# Patient Record
Sex: Female | Born: 1980 | Race: Black or African American | Hispanic: No | Marital: Single | Smoking: Former smoker
Health system: Southern US, Community
[De-identification: ages and names within clinical notes are randomized; demographics above are authoritative.]

## PROBLEM LIST (undated history)

## (undated) ENCOUNTER — Inpatient Hospital Stay (HOSPITAL_COMMUNITY): Payer: Self-pay

## (undated) DIAGNOSIS — A599 Trichomoniasis, unspecified: Secondary | ICD-10-CM

## (undated) DIAGNOSIS — N200 Calculus of kidney: Secondary | ICD-10-CM

## (undated) DIAGNOSIS — I499 Cardiac arrhythmia, unspecified: Secondary | ICD-10-CM

## (undated) DIAGNOSIS — K219 Gastro-esophageal reflux disease without esophagitis: Secondary | ICD-10-CM

## (undated) DIAGNOSIS — R7303 Prediabetes: Secondary | ICD-10-CM

## (undated) HISTORY — PX: TONSILLECTOMY: SUR1361

## (undated) HISTORY — PX: CHOLECYSTECTOMY: SHX55

---
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
## 2001-11-19 ENCOUNTER — Emergency Department (HOSPITAL_COMMUNITY): Admission: EM | Admit: 2001-11-19 | Discharge: 2001-11-19 | Payer: Self-pay | Admitting: Emergency Medicine

## 2001-11-19 ENCOUNTER — Encounter: Payer: Self-pay | Admitting: Emergency Medicine

## 2004-03-15 ENCOUNTER — Emergency Department (HOSPITAL_COMMUNITY): Admission: EM | Admit: 2004-03-15 | Discharge: 2004-03-15 | Payer: Self-pay | Admitting: Emergency Medicine

## 2004-12-01 ENCOUNTER — Other Ambulatory Visit: Admission: RE | Admit: 2004-12-01 | Discharge: 2004-12-01 | Payer: Self-pay | Admitting: Family Medicine

## 2005-08-27 ENCOUNTER — Inpatient Hospital Stay (HOSPITAL_COMMUNITY): Admission: AD | Admit: 2005-08-27 | Discharge: 2005-08-28 | Payer: Self-pay | Admitting: Obstetrics & Gynecology

## 2006-03-27 ENCOUNTER — Ambulatory Visit (HOSPITAL_COMMUNITY): Admission: RE | Admit: 2006-03-27 | Discharge: 2006-03-27 | Payer: Self-pay | Admitting: Family Medicine

## 2007-02-13 ENCOUNTER — Other Ambulatory Visit: Admission: RE | Admit: 2007-02-13 | Discharge: 2007-02-13 | Payer: Self-pay | Admitting: Family Medicine

## 2007-06-06 ENCOUNTER — Emergency Department (HOSPITAL_COMMUNITY): Admission: EM | Admit: 2007-06-06 | Discharge: 2007-06-07 | Payer: Self-pay | Admitting: Emergency Medicine

## 2007-06-16 ENCOUNTER — Emergency Department (HOSPITAL_COMMUNITY): Admission: EM | Admit: 2007-06-16 | Discharge: 2007-06-16 | Payer: Self-pay | Admitting: Emergency Medicine

## 2008-09-30 ENCOUNTER — Encounter: Admission: RE | Admit: 2008-09-30 | Discharge: 2008-09-30 | Payer: Self-pay | Admitting: Family Medicine

## 2008-09-30 ENCOUNTER — Encounter (INDEPENDENT_AMBULATORY_CARE_PROVIDER_SITE_OTHER): Payer: Self-pay | Admitting: *Deleted

## 2008-10-14 ENCOUNTER — Ambulatory Visit (HOSPITAL_COMMUNITY): Admission: RE | Admit: 2008-10-14 | Discharge: 2008-10-14 | Payer: Self-pay | Admitting: General Surgery

## 2008-10-14 ENCOUNTER — Encounter (INDEPENDENT_AMBULATORY_CARE_PROVIDER_SITE_OTHER): Payer: Self-pay | Admitting: *Deleted

## 2008-10-14 ENCOUNTER — Encounter (INDEPENDENT_AMBULATORY_CARE_PROVIDER_SITE_OTHER): Payer: Self-pay | Admitting: General Surgery

## 2009-05-17 ENCOUNTER — Ambulatory Visit: Payer: Self-pay | Admitting: Gastroenterology

## 2009-05-17 DIAGNOSIS — R1011 Right upper quadrant pain: Secondary | ICD-10-CM

## 2009-05-17 LAB — CONVERTED CEMR LAB
ALT: 11 units/L (ref 0–35)
Alkaline Phosphatase: 62 units/L (ref 39–117)
Bilirubin, Direct: 0.1 mg/dL (ref 0.0–0.3)
Total Bilirubin: 0.3 mg/dL (ref 0.3–1.2)

## 2009-05-19 ENCOUNTER — Ambulatory Visit (HOSPITAL_COMMUNITY): Admission: RE | Admit: 2009-05-19 | Discharge: 2009-05-19 | Payer: Self-pay | Admitting: Gastroenterology

## 2009-05-20 ENCOUNTER — Telehealth: Payer: Self-pay | Admitting: Gastroenterology

## 2009-05-23 DIAGNOSIS — R112 Nausea with vomiting, unspecified: Secondary | ICD-10-CM

## 2009-05-25 ENCOUNTER — Ambulatory Visit: Payer: Self-pay | Admitting: Gastroenterology

## 2009-05-30 ENCOUNTER — Emergency Department (HOSPITAL_COMMUNITY): Admission: EM | Admit: 2009-05-30 | Discharge: 2009-05-31 | Payer: Self-pay | Admitting: *Deleted

## 2009-06-02 ENCOUNTER — Ambulatory Visit: Payer: Self-pay | Admitting: Gastroenterology

## 2009-06-03 ENCOUNTER — Telehealth: Payer: Self-pay | Admitting: Gastroenterology

## 2009-06-22 ENCOUNTER — Telehealth: Payer: Self-pay | Admitting: Gastroenterology

## 2009-06-22 ENCOUNTER — Encounter: Payer: Self-pay | Admitting: Gastroenterology

## 2009-07-15 ENCOUNTER — Ambulatory Visit (HOSPITAL_COMMUNITY): Admission: RE | Admit: 2009-07-15 | Discharge: 2009-07-15 | Payer: Self-pay | Admitting: Gastroenterology

## 2009-07-20 ENCOUNTER — Encounter: Payer: Self-pay | Admitting: Gastroenterology

## 2009-08-10 ENCOUNTER — Ambulatory Visit: Payer: Self-pay | Admitting: Gastroenterology

## 2009-09-21 ENCOUNTER — Emergency Department (HOSPITAL_COMMUNITY): Admission: EM | Admit: 2009-09-21 | Discharge: 2009-09-21 | Payer: Self-pay | Admitting: Emergency Medicine

## 2010-02-27 ENCOUNTER — Emergency Department (HOSPITAL_COMMUNITY): Admission: EM | Admit: 2010-02-27 | Discharge: 2010-02-27 | Payer: Self-pay | Admitting: Family Medicine

## 2010-11-18 ENCOUNTER — Emergency Department (HOSPITAL_COMMUNITY): Admission: EM | Admit: 2010-11-18 | Discharge: 2010-11-18 | Payer: Self-pay | Admitting: Emergency Medicine

## 2010-12-07 ENCOUNTER — Emergency Department (HOSPITAL_COMMUNITY): Admission: EM | Admit: 2010-12-07 | Discharge: 2010-03-22 | Payer: Self-pay | Admitting: Emergency Medicine

## 2010-12-25 IMAGING — CR DG CHEST 2V
2 series · 2 of 2 positions shown · non-contrast
Comparison: 06/06/2007

CLINICAL DATA: Shortness of breath

CHEST - 2 VIEW

[w chest pa]
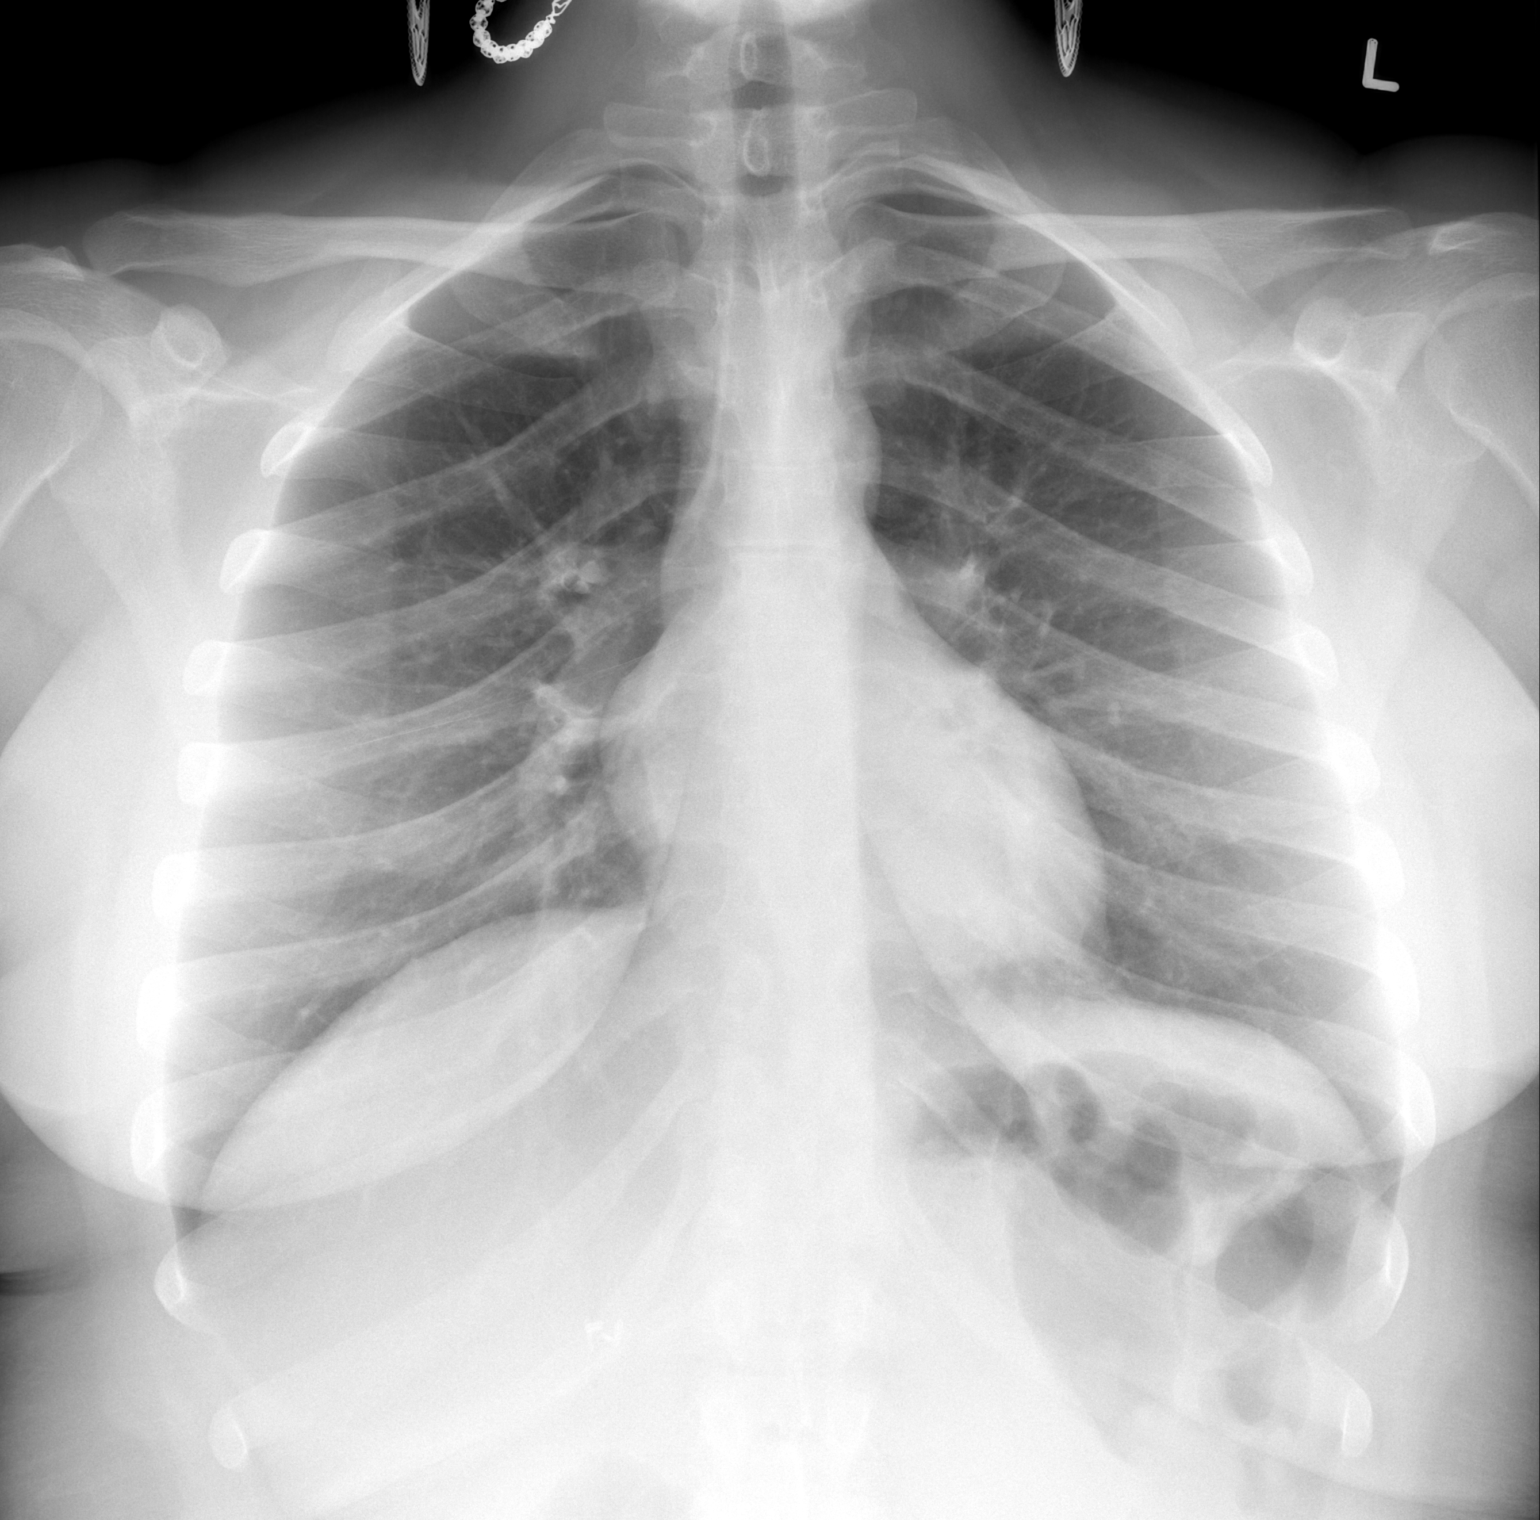

[w chest lat]
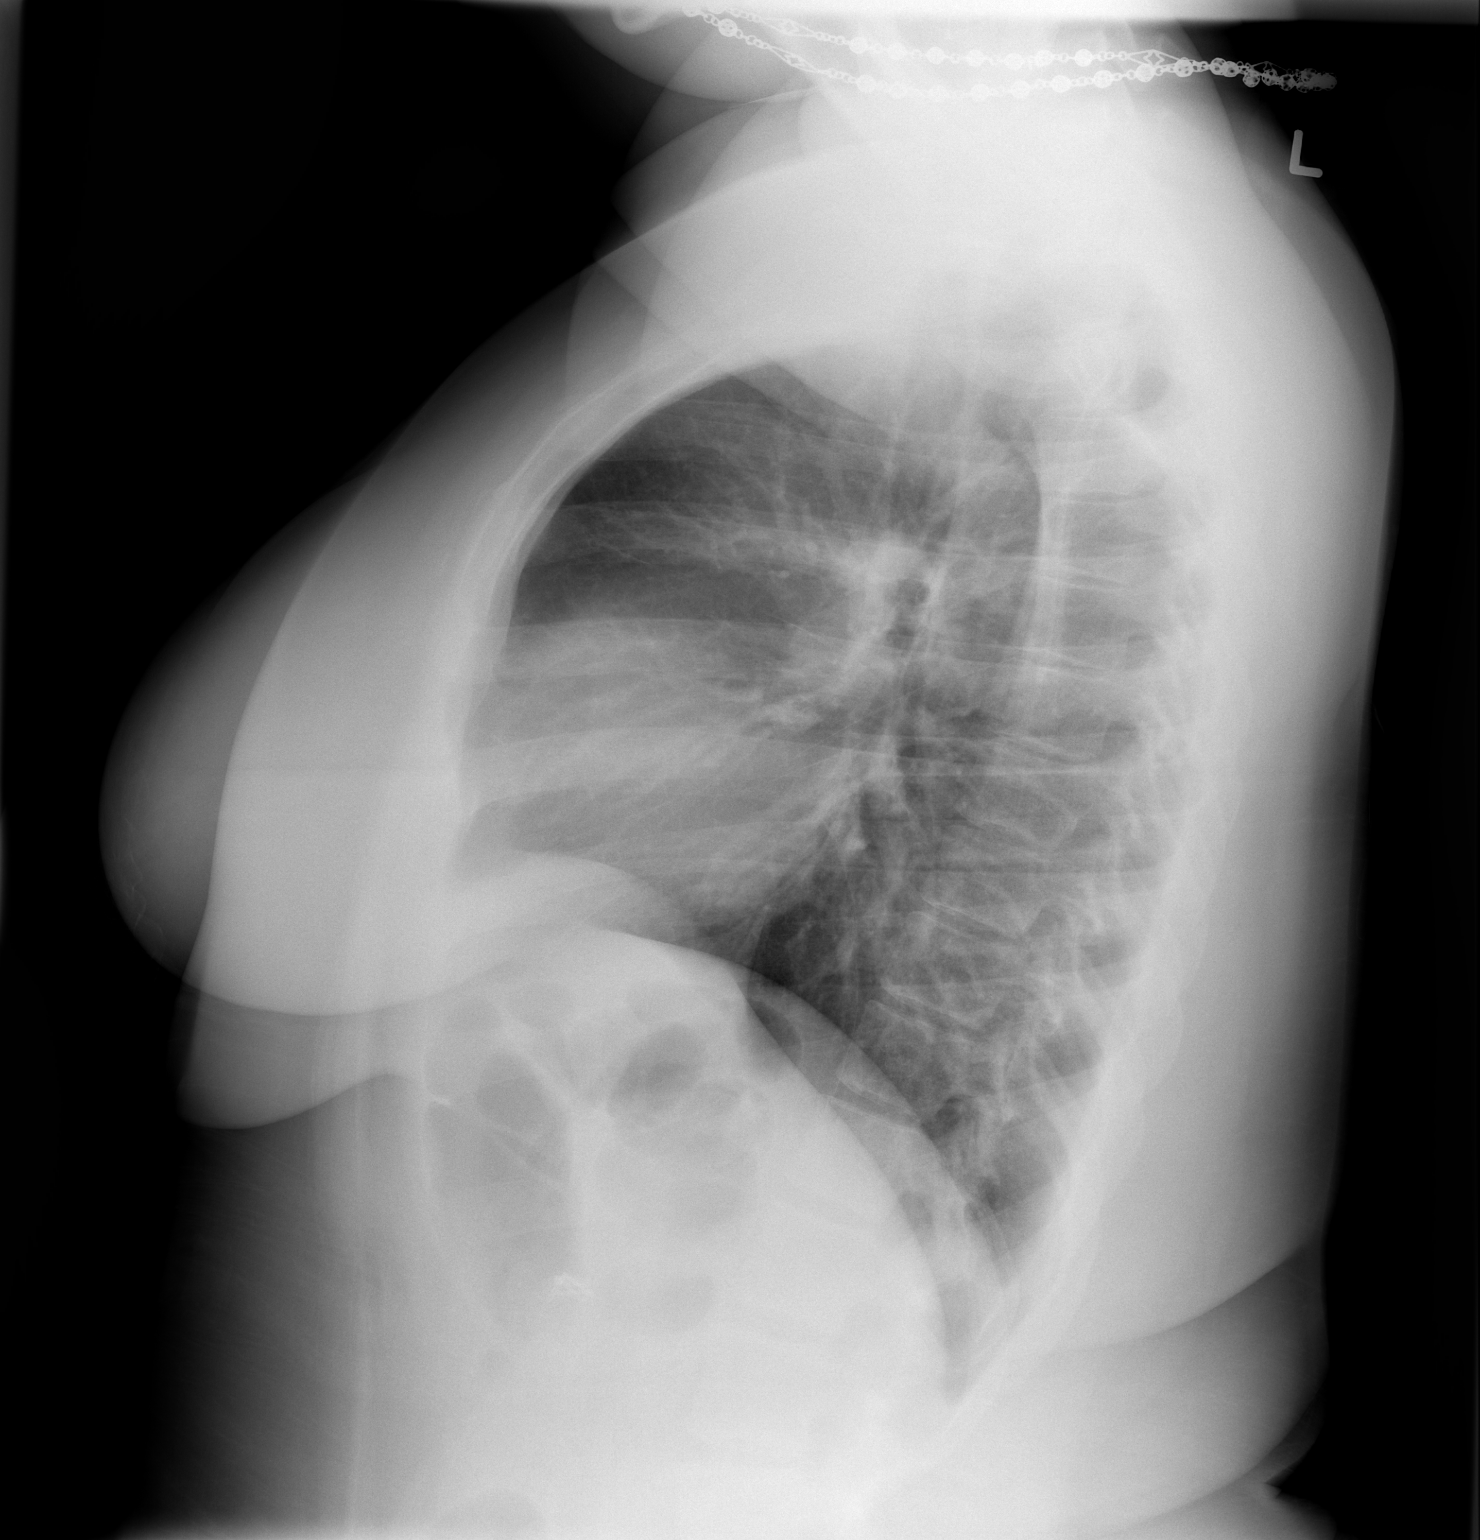

[2 of 2 positions shown; findings below may reference images not displayed]

FINDINGS: The lungs are clear.  Heart and mediastinal contours are
normal.  Osseous structures unremarkable.
IMPRESSION: No acute cardiopulmonary process

## 2011-01-22 ENCOUNTER — Encounter: Payer: Self-pay | Admitting: Gastroenterology

## 2011-03-13 LAB — POCT CARDIAC MARKERS
CKMB, poc: <1 ng/mL — ABNORMAL LOW (ref 1.0–8.0)
Myoglobin, poc: 43.4 ng/mL (ref 12–200)
Troponin i, poc: <0.05 ng/mL (ref 0.00–0.09)

## 2011-03-13 LAB — DIFFERENTIAL
Basophils Absolute: 0 10*3/uL (ref 0.0–0.1)
Basophils Relative: 0 % (ref 0–1)
Lymphocytes Relative: 33 % (ref 12–46)
Monocytes Absolute: 0.6 10*3/uL (ref 0.1–1.0)
Neutro Abs: 2.8 10*3/uL (ref 1.7–7.7)
Neutrophils Relative %: 54 % (ref 43–77)

## 2011-03-13 LAB — CBC
HCT: 35.9 % — ABNORMAL LOW (ref 36.0–46.0)
Hemoglobin: 12.1 g/dL (ref 12.0–15.0)
Platelets: 330 10*3/uL (ref 150–400)
RBC: 4.09 MIL/uL (ref 3.87–5.11)
RDW: 13 % (ref 11.5–15.5)
WBC: 5.1 10*3/uL (ref 4.0–10.5)

## 2011-03-13 LAB — BASIC METABOLIC PANEL
BUN: 10 mg/dL (ref 6–23)
GFR calc Af Amer: >60 mL/min (ref >60)
GFR calc non Af Amer: >60 mL/min (ref >60)

## 2011-03-13 LAB — GLUCOSE, CAPILLARY: Glucose-Capillary: 97 mg/dL (ref 70–99)

## 2011-03-25 LAB — COMPREHENSIVE METABOLIC PANEL
ALT: 10 U/L (ref 0–35)
Albumin: 3.7 g/dL (ref 3.5–5.2)
Alkaline Phosphatase: 51 U/L (ref 39–117)
BUN: 9 mg/dL (ref 6–23)
Calcium: 8.7 mg/dL (ref 8.4–10.5)
Chloride: 109 mEq/L (ref 96–112)
Creatinine, Ser: 0.77 mg/dL (ref 0.4–1.2)
GFR calc non Af Amer: >60 mL/min (ref >60)
Glucose, Bld: 111 mg/dL — ABNORMAL HIGH (ref 70–99)

## 2011-03-25 LAB — DIFFERENTIAL
Basophils Absolute: 0.1 10*3/uL (ref 0.0–0.1)
Lymphs Abs: 2.1 10*3/uL (ref 0.7–4.0)
Monocytes Absolute: 0.6 10*3/uL (ref 0.1–1.0)
Monocytes Relative: 9 % (ref 3–12)

## 2011-03-25 LAB — CBC: HCT: 37.2 % (ref 36.0–46.0)

## 2011-03-25 LAB — LIPASE, BLOOD: Lipase: 27 U/L (ref 11–59)

## 2011-04-09 LAB — POCT I-STAT, CHEM 8
BUN: 11 mg/dL (ref 6–23)
Calcium, Ion: 1.21 mmol/L (ref 1.12–1.32)
HCT: 42 % (ref 36.0–46.0)
Potassium: 3.5 mEq/L (ref 3.5–5.1)
TCO2: 26 mmol/L (ref 0–100)

## 2011-04-09 LAB — POCT CARDIAC MARKERS
CKMB, poc: <1 ng/mL — ABNORMAL LOW (ref 1.0–8.0)
Troponin i, poc: <0.05 ng/mL (ref 0.00–0.09)

## 2011-04-17 ENCOUNTER — Other Ambulatory Visit: Payer: Self-pay | Admitting: Family Medicine

## 2011-04-17 DIAGNOSIS — M545 Low back pain: Secondary | ICD-10-CM

## 2011-04-23 ENCOUNTER — Ambulatory Visit
Admission: RE | Admit: 2011-04-23 | Discharge: 2011-04-23 | Disposition: A | Discharge status: Home / Self Care | Payer: BC Managed Care – PPO | Source: Ambulatory Visit | Attending: Family Medicine | Admitting: Family Medicine

## 2011-04-23 DIAGNOSIS — M545 Low back pain: Secondary | ICD-10-CM

## 2011-05-15 NOTE — Op Note (Signed)
NAMELORAL, CAMPI               ACCOUNT NO.:  [id number]   MEDICAL RECORD NO.:  [id number]          PATIENT TYPE:  AMB   LOCATION:  DAY                          FACILITY:  [facility]   PHYSICIAN:  Almond Lint, MD       DATE OF BIRTH:  06-25-1981   DATE OF PROCEDURE:  10/14/2008  DATE OF DISCHARGE:                               OPERATIVE REPORT   PREOPERATIVE DIAGNOSIS:  Symptomatic cholelithiasis.   POSTOPERATIVE DIAGNOSIS:  Symptomatic cholelithiasis.   PROCEDURE PERFORMED:  Laparoscope cholecystectomy.   SURGEON:  Almond Lint, MD   ASSISTANT:  Juanetta Gosling, MD   ANESTHESIA:  General and local.   FINDINGS:  Contracted gallbladder with significant omentum stuck to it  with a very small cystic duct.   SPECIMEN:  Gallbladder to pathology.   ESTIMATED BLOOD LOSS:  Minimal.   COMPLICATIONS:  None known.   PROCEDURE IN DETAIL:  Ms. Sergent was identified in the holding area and  taken to the operating room where she was placed upon the operating room  table.  General endotracheal anesthesia was induced.  Her abdomen was  prepped and draped in a sterile fashion.  A time-out was performed  according to the surgical safety check list.  Her infraumbilical skin  was anesthetized with a mixture of 1% lidocaine with epinephrine and  0.25% Marcaine plain.  The incision was made vertically with an 11 blade  and the subcutaneous tissues divided bluntly with a Kelly clamp.  Kocher  was used to elevate the umbilical stalk and then another Zannie Cove was used  to grasp the fascia on one side of the midline.  The original Zannie Cove was  removed to the other side of the midline.  The fascia was entered  sharply with the 11 blade.  A Kelly clamp was advanced into the  abdominal cavity to see, to make sure that we were not still  preperitoneal.  There was an additional layer of peritoneum and the  fascia was grasped on the inside of the fascial incision.  The  peritoneum was entered sharply and  confirmed with a Kelly clamp.  A  pursestring 2-0 Vicryl suture was placed around the fascial incision.  The Hasson trocar was advanced into the abdomen and the Kochers were  removed.  Pneumoperitoneum was achieved to a pressure of 50 mmHg.  The  camera was advanced into the abdomen and the epigastric region was  identified.  Local anesthesia was used to identify the site internally  where we put the port and to anesthetize the skin.  The 10/11 port was  placed through the abdominal wall under direct visualization in standard  fashion.  Attention was then directed to the right upper quadrant.  Two  5 mm ports were placed under direct visualization in a standard fashion  as well.  The abdominal wall was very lax and this had to be retracted  with a grasper in order to provide some countertension.  Originally once  the gallbladder was identified there was a significant amount of omentum  stuck all the way up to the dome of  the gallbladder.  The omentum was  removed from the gallbladder with a Art gallery manager.  Care was taken  not to enter the duodenum.  Once the fundus of the gallbladder was  exposed this was grasped and elevated toward the head.  The omentum was  continued to be stripped off of the surface of the gallbladder.  Once  the infundibulum was exposed this was retracted laterally.  There was  still significant amount of omentum stuck to the ductal structures and  this was carefully removed very slowly.  The cystic duct was identified  directly under the gallbladder and this was skeletonized.  The cystic  artery was seen just behind a node of Calot just medial to the cystic  duct and this also was skeletonized.  A clip was placed on the  gallbladder side of the cystic duct and a small ductotomy made with the  scissors.  The cholangiogram catheter was advanced through the abdominal  wall but was unable to be cannulated due to the cystic duct due to the  small size of the ducts.   The critical view was then obtained with the  cystic duct seen directly entering the gallbladder from both the  anterior and the lateral positions.  The duct was then clipped three  times on the patient's side.  A second clip applier was obtained it  appeared that the clip applier was not firing appropriately and keeping  the clips tight.  The second clip applier was functioning much better.  The ductotomy was continued across the duct and the artery was then  clipped twice on the patient's side and once on the specimen side.  This  was then transected and the Bovie electrocautery was used to take the  gallbladder off of the gallbladder fossa.  There was one site where the  liver capsule was torn and this was coagulated with the Bovie.  Once the  gallbladder was completely off of the gallbladder fossa the gallbladder  fossa was again reexamined and there were a few small sites of oozing.  These were coagulated with the Bovie and then irrigated and reinspected.  No additional oozing was seen.  The clips were inspected and there was  no bleeding or bile leakage seen.  The camera was then removed to the  epigastric port and the gallbladder placed into an EndoCatch bag and  pulled out through the umbilical incision.  The camera was then replaced  back through the umbilical incision and the gallbladder fossa  reexamined.  Again, there was no bleeding or bile leakage seen.  The  rest of the irrigation was suctioned through the abdomen and the three  percutaneously placed ports were removed under direct visualization.  The pneumoperitoneum was allowed to escape through the umbilical port  and then Hasson removed.  The pursestring suture was used to secure the  fascial incision.  The fascial incision was then palpated to ensure  there were no weak spots.  There were none and the skin of all the  incisions was closed using a 4-0 Monocryl in a subcuticular fashion.  The abdomen was then washed and  dried and dressed with Dermabond.  The  patient was awakened from anesthesia and taken to the PACU in stable  condition.  Needle and sponge counts were correct x2.      Almond Lint, MD  Electronically Signed     FB/MEDQ  D:  10/14/2008  T:  10/14/2008  Job:  [id number]

## 2011-10-01 LAB — DIFFERENTIAL
Eosinophils Absolute: 0.1
Eosinophils Relative: 1
Lymphs Abs: 2.8

## 2011-10-01 LAB — COMPREHENSIVE METABOLIC PANEL
ALT: 8
AST: 12
Albumin: 3.8
GFR calc Af Amer: >60
Glucose, Bld: 98
Total Bilirubin: 0.6
Total Protein: 7.1

## 2011-10-01 LAB — CBC
HCT: 39.6
MCHC: 33.2
Platelets: 382
RBC: 4.49
WBC: 5.4

## 2012-01-20 ENCOUNTER — Encounter (HOSPITAL_COMMUNITY): Payer: Self-pay | Admitting: *Deleted

## 2012-01-20 ENCOUNTER — Inpatient Hospital Stay (HOSPITAL_COMMUNITY): Payer: BC Managed Care – PPO

## 2012-01-20 ENCOUNTER — Inpatient Hospital Stay (HOSPITAL_COMMUNITY)
Admission: AD | Admit: 2012-01-20 | Discharge: 2012-01-20 | Disposition: A | Discharge status: Home / Self Care | Payer: BC Managed Care – PPO | Source: Ambulatory Visit | Attending: Obstetrics & Gynecology | Admitting: Obstetrics & Gynecology

## 2012-01-20 DIAGNOSIS — O209 Hemorrhage in early pregnancy, unspecified: Secondary | ICD-10-CM

## 2012-01-20 HISTORY — DX: Trichomoniasis, unspecified: A59.9

## 2012-01-20 LAB — ABO/RH: ABO/RH(D): O POS

## 2012-01-20 LAB — URINALYSIS, ROUTINE W REFLEX MICROSCOPIC
Bilirubin Urine: NEGATIVE
Glucose, UA: NEGATIVE mg/dL
Ketones, ur: NEGATIVE mg/dL
pH: 7 (ref 5.0–8.0)

## 2012-01-20 LAB — URINE MICROSCOPIC-ADD ON: WBC, UA: NONE SEEN WBC/hpf (ref <3)

## 2012-01-20 LAB — WET PREP, GENITAL: Yeast Wet Prep HPF POC: NONE SEEN

## 2012-01-20 LAB — CBC
Platelets: 366 10*3/uL (ref 150–400)
RDW: 14.3 % (ref 11.5–15.5)
WBC: 6.2 10*3/uL (ref 4.0–10.5)

## 2012-01-20 LAB — HCG, QUANTITATIVE, PREGNANCY: hCG, Beta Chain, Quant, S: 21865 m[IU]/mL — ABNORMAL HIGH (ref <5)

## 2012-01-20 NOTE — Progress Notes (Signed)
Positive home pregnancy tests x 4, woke up with cramping and bleeding was heavy this morning has lightened up now, mild cramp, N/V, LMP 12/09/11 longer than usual

## 2012-01-20 NOTE — Progress Notes (Signed)
Woke up today with cramping and bleeding after +HPT.

## 2012-01-20 NOTE — ED Provider Notes (Signed)
History     Chief Complaint  Patient presents with  . Abdominal Cramping  . Vaginal Bleeding   HPI Bonnie Carr is 31 y.o. G2 P0010.  LMP 12/09/11.  Was taking birth control pill as directed.  4+ UPTs at home.  Woke up this am with bleeding and cramping.  1 partner.     No past surgical history on file.  No family history on file.  History  Substance Use Topics  . Smoking status: Not on file  . Smokeless tobacco: Not on file  . Alcohol Use: Not on file    Allergies: Allergies not on file  No prescriptions prior to admission    Review of Systems  Constitutional: Negative.   Gastrointestinal: Positive for abdominal pain (mild cramping).  Genitourinary:       + for vaginal bleeding   Physical Exam   Blood pressure 111/66, pulse 78, temperature 98.8 F (37.1 C), height 5\' 6"  (1.676 m), weight 212 lb 6.4 oz (96.344 kg), last menstrual period 12/09/2011.  Physical Exam  Constitutional: She is oriented to person, place, and time. She appears well-developed and well-nourished. No distress.  HENT:  Head: Normocephalic.  Neck: Normal range of motion.  Cardiovascular: Normal rate.   Respiratory: Effort normal.  GI: Soft. She exhibits no distension and no mass. There is no tenderness. There is no rebound and no guarding.  Genitourinary: Uterus is not tender. Right adnexum displays no mass, no tenderness and no fullness. Left adnexum displays no mass, no tenderness and no fullness. There is bleeding (moderate amount of brown blood, with 1 moderate size clot) around the vagina.  Neurological: She is alert and oriented to person, place, and time.  Skin: Skin is warm and dry.   Results for orders placed during the hospital encounter of 01/20/12 (from the past 24 hour(s))  URINALYSIS, ROUTINE W REFLEX MICROSCOPIC     Status: Abnormal   Collection Time   01/20/12  1:40 PM      Component Value Range   Color, Urine YELLOW  YELLOW    APPearance CLEAR  CLEAR    Specific Gravity,  Urine 1.015  1.005 - 1.030    pH 7.0  5.0 - 8.0    Glucose, UA NEGATIVE  NEGATIVE (mg/dL)   Hgb urine dipstick MODERATE (*) NEGATIVE    Bilirubin Urine NEGATIVE  NEGATIVE    Ketones, ur NEGATIVE  NEGATIVE (mg/dL)   Protein, ur NEGATIVE  NEGATIVE (mg/dL)   Urobilinogen, UA 0.2  0.0 - 1.0 (mg/dL)   Nitrite NEGATIVE  NEGATIVE    Leukocytes, UA NEGATIVE  NEGATIVE   URINE MICROSCOPIC-ADD ON     Status: Abnormal   Collection Time   01/20/12  1:40 PM      Component Value Range   Squamous Epithelial / LPF FEW (*) RARE    WBC, UA    <3 (WBC/hpf)   Value: NO FORMED ELEMENTS SEEN ON URINE MICROSCOPIC EXAMINATION   RBC / HPF 0-2  <3 (RBC/hpf)   Bacteria, UA RARE  RARE   POCT PREGNANCY, URINE     Status: Normal   Collection Time   01/20/12  1:52 PM      Component Value Range   Preg Test, Ur POSITIVE    POCT URINE PREGNANCY     Status: Normal   Collection Time   01/20/12  2:00 PM      Component Value Range   Preg Test, Ur Positive    WET PREP, GENITAL  Status: Abnormal   Collection Time   01/20/12  2:10 PM      Component Value Range   Yeast, Wet Prep NONE SEEN  NONE SEEN    Trich, Wet Prep NONE SEEN  NONE SEEN    Clue Cells, Wet Prep NONE SEEN  NONE SEEN    WBC, Wet Prep HPF POC FEW (*) NONE SEEN   CBC     Status: Normal   Collection Time   01/20/12  2:20 PM      Component Value Range   WBC 6.2  4.0 - 10.5 (K/uL)   RBC 4.35  3.87 - 5.11 (MIL/uL)   Hemoglobin 12.7  12.0 - 15.0 (g/dL)   HCT 16.1  09.6 - 04.5 (%)   MCV 87.4  78.0 - 100.0 (fL)   MCH 29.2  26.0 - 34.0 (pg)   MCHC 33.4  30.0 - 36.0 (g/dL)   RDW 40.9  81.1 - 91.4 (%)   Platelets 366  150 - 400 (K/uL)  HCG, QUANTITATIVE, PREGNANCY     Status: Abnormal   Collection Time   01/20/12  2:20 PM      Component Value Range   hCG, Beta Chain, Quant, S 21865 (*) <5 (mIU/mL)  ABO/RH     Status: Normal   Collection Time   01/20/12  2:20 PM      Component Value Range   ABO/RH(D) O POS     ULTRASOUND:  IUGS with YS, FP, and  caridac activity of 97 at [redacted]wks gestation.  Moderate subchorionic hematoma.  No FF and ovaries WNL.   MAU Course  Procedures  GC/Chla culture to lab  MDM  Reviewed labs and U/s report with the patient  Assessment and Plan  A:  Bleeding in early pregnancy      Living IUP  P:  Pelvic rest until bleeding stops.  Return for heavier bleeding      Begin prenatal care with doctor of your choice.  Grover Robinson,EVE M 01/20/2012, 1:43 PM   Matt Holmes, NP 01/20/12 1609

## 2012-01-21 LAB — GC/CHLAMYDIA PROBE AMP, GENITAL: GC Probe Amp, Genital: NEGATIVE

## 2012-04-29 ENCOUNTER — Encounter (HOSPITAL_COMMUNITY): Payer: Self-pay | Admitting: *Deleted

## 2012-04-29 ENCOUNTER — Inpatient Hospital Stay (HOSPITAL_COMMUNITY)
Admission: AD | Admit: 2012-04-29 | Discharge: 2012-04-30 | DRG: 368 | Disposition: A | Discharge status: Home / Self Care | Payer: BC Managed Care – PPO | Source: Ambulatory Visit | Attending: Obstetrics & Gynecology | Admitting: Obstetrics & Gynecology

## 2012-04-29 DIAGNOSIS — A5400 Gonococcal infection of lower genitourinary tract, unspecified: Secondary | ICD-10-CM | POA: Diagnosis present

## 2012-04-29 DIAGNOSIS — N739 Female pelvic inflammatory disease, unspecified: Secondary | ICD-10-CM | POA: Diagnosis present

## 2012-04-29 DIAGNOSIS — A5619 Other chlamydial genitourinary infection: Secondary | ICD-10-CM | POA: Diagnosis present

## 2012-04-29 DIAGNOSIS — A5901 Trichomonal vulvovaginitis: Secondary | ICD-10-CM

## 2012-04-29 DIAGNOSIS — N949 Unspecified condition associated with female genital organs and menstrual cycle: Secondary | ICD-10-CM | POA: Diagnosis present

## 2012-04-29 DIAGNOSIS — A6000 Herpesviral infection of urogenital system, unspecified: Secondary | ICD-10-CM | POA: Diagnosis present

## 2012-04-29 LAB — WET PREP, GENITAL
Clue Cells Wet Prep HPF POC: NONE SEEN
Yeast Wet Prep HPF POC: NONE SEEN

## 2012-04-29 MED ORDER — CEFTRIAXONE SODIUM 250 MG IJ SOLR
250.0000 mg | Freq: Once | INTRAMUSCULAR | Status: AC
  Administered 2012-04-29: 250 mg via INTRAMUSCULAR
  Filled 2012-04-29: qty 250

## 2012-04-29 MED ORDER — VALACYCLOVIR HCL 1 G PO TABS: 1000.0000 mg | ORAL_TABLET | Freq: Two times a day (BID) | ORAL | Status: DC

## 2012-04-29 MED ORDER — AZITHROMYCIN 250 MG PO TABS
1000.0000 mg | ORAL_TABLET | Freq: Once | ORAL | Status: AC
  Administered 2012-04-29: 1000 mg via ORAL
  Filled 2012-04-29: qty 4

## 2012-04-29 NOTE — MAU Provider Note (Signed)
History     CSN: [id number]  Arrival date & time 04/29/12  2154   None     Chief Complaint  Patient presents with  . Vaginal Discharge   HPI Bonnie Carr is a 31 y.o. female who presents to MAU for severe vaginal pain. The pain started about 5 days ago and she thought it was a yeast infection so used OTC yeast cream without relief. The symptoms have gotten worse with more discharge. The patient has been with her current sex partner for 10 years. The history was provided by the patient.  Past Medical History  Diagnosis Date  . Trichomonas     Past Surgical History  Procedure Date  . Cholecystectomy     Family History  Problem Relation Age of Onset  . Hypertension Mother   . Heart disease Mother   . Diabetes Father   . Diabetes Maternal Grandmother     History  Substance Use Topics  . Smoking status: Never Smoker   . Smokeless tobacco: Not on file  . Alcohol Use: Yes    OB History    Grav Para Term Preterm Abortions TAB SAB Ect Mult Living   2    2 2           Review of Systems  Genitourinary: Positive for vaginal discharge and vaginal pain.  All other systems reviewed and are negative.    Allergies  Review of patient's allergies indicates no known allergies.  Home Medications  No current outpatient prescriptions on file.  BP 127/79  Pulse 75  Temp(Src) 99.3 F (37.4 C) (Oral)  Resp 18  Ht 5\' 9"  (1.753 m)  Wt 232 lb (105.235 kg)  BMI 34.26 kg/m2  SpO2 100%  LMP 04/19/2011  Breastfeeding? Unknown  Physical Exam  Nursing note and vitals reviewed. Constitutional: She is oriented to person, place, and time. She appears well-developed and well-nourished.  HENT:  Head: Normocephalic.  Eyes: EOM are normal.  Neck: Neck supple.  Cardiovascular: Normal rate.   Pulmonary/Chest: Effort normal.  Genitourinary:          External genitalia with vesicular lesions noted on the labia minor bilaterally. Tender on exam. Cervix inflamed, frothy yellow  discharge vaginal vault.   Musculoskeletal: Normal range of motion.  Neurological: She is alert and oriented to person, place, and time. No cranial nerve deficit.  Skin: Skin is warm and dry.  Psychiatric: She has a normal mood and affect. Her behavior is normal. Judgment and thought content normal.   Results for orders placed during the hospital encounter of 04/29/12 (from the past 24 hour(s))  WET PREP, GENITAL     Status: Abnormal   Collection Time   04/29/12 10:35 PM      Component Value Range   Yeast Wet Prep HPF POC NONE SEEN  NONE SEEN    Trich, Wet Prep MODERATE (*) NONE SEEN    Clue Cells Wet Prep HPF POC NONE SEEN  NONE SEEN    WBC, Wet Prep HPF POC MODERATE (*) NONE SEEN    Assessment: Genital lesions   Trichomonas vaginosis   Vaginal discharge    Diff. Dx Genital herpes   Gonorrhea   Chlamydia  Plan:  Rocephin 250 mg IM   Zithromax 1 gram PO   Rx Valtrex   Rx Vicodin   GC, Chlamydia and HSV cultures pending   Follow up with [facility]   Return as needed   ED Course: discussed in detail with patient clinical  and lab findings and need for partner treatment and follow up with STI Clinic for further testing HIV, Syphillis and Hepatitis. Patient voices understanding.   Procedures MDM

## 2012-04-29 NOTE — Discharge Instructions (Signed)
Genital Herpes Genital herpes is a sexually transmitted disease. This means that it is a disease passed by having sex with an infected person. There is no cure for genital herpes. The time between attacks can be months to years. The virus may live in a person but produce no problems (symptoms). This infection can be passed to a baby as it travels down the birth canal (vagina). In a newborn, this can cause central nervous system damage, eye damage, or even death. The virus that causes genital herpes is usually HSV-2 virus. The virus that causes oral herpes is usually HSV-1. The diagnosis (learning what is wrong) is made through culture results. SYMPTOMS  Usually symptoms of pain and itching begin a few days to a week after contact. It first appears as small blisters that progress to small painful ulcers which then scab over and heal after several days. It affects the outer genitalia, birth canal, cervix, penis, anal area, buttocks, and thighs. HOME CARE INSTRUCTIONS   Keep ulcerated areas dry and clean.   Take medications as directed. Antiviral medications can speed up healing. They will not prevent recurrences or cure this infection. These medications can also be taken for suppression if there are frequent recurrences.   While the infection is active, it is contagious. Avoid all sexual contact during active infections.   Condoms may help prevent spread of the herpes virus.   Practice safe sex.   Wash your hands thoroughly after touching the genital area.   Avoid touching your eyes after touching your genital area.   Inform your caregiver if you have had genital herpes and become pregnant. It is your responsibility to insure a safe outcome for your baby in this pregnancy.   Only take over-the-counter or prescription medicines for pain, discomfort, or fever as directed by your caregiver.  SEEK MEDICAL CARE IF:   You have a recurrence of this infection.   You do not respond to medications and  are not improving.   You have new sources of pain or discharge which have changed from the original infection.   You have an oral temperature above 102 F (38.9 C).   You develop abdominal pain.   You develop eye pain or signs of eye infection.  Document Released: 12/14/2000 Document Revised: 12/06/2011 Document Reviewed: 01/04/2010 [facility] Patient Information 2012 [place], [place].Trichomoniasis Trichomoniasis is an infection, caused by the Trichomonas organism, that affects both women and men. In women, the outer female genitalia and the vagina are affected. In men, the penis is mainly affected, but the prostate and other reproductive organs can also be involved. Trichomoniasis is a sexually transmitted disease (STD) and is most often passed to another person through sexual contact. The majority of people who get trichomoniasis do so from a sexual encounter and are also at risk for other STDs. CAUSES   Sexual intercourse with an infected partner.   It can be present in swimming pools or hot tubs.  SYMPTOMS   Abnormal gray-green frothy vaginal discharge in women.   Vaginal itching and irritation in women.   Itching and irritation of the area outside the vagina in women.   Penile discharge with or without pain in males.   Inflammation of the urethra (urethritis), causing painful urination.   Bleeding after sexual intercourse.  RELATED COMPLICATIONS  Pelvic inflammatory disease.   Infection of the uterus (endometritis).   Infertility.   Tubal (ectopic) pregnancy.   It can be associated with other STDs, including gonorrhea and chlamydia, hepatitis B,  and HIV.  COMPLICATIONS DURING PREGNANCY  Early (premature) delivery.   Premature rupture of the membranes (PROM).   Low birth weight.  DIAGNOSIS   Visualization of Trichomonas under the microscope from the vagina discharge.   Ph of the vagina greater than 4.5, tested with a test tape.   Trich Rapid Test.   Culture  of the organism, but this is not usually needed.   It may be found on a Pap test.   Having a "strawberry cervix,"which means the cervix looks very red like a strawberry.  TREATMENT   You may be given medication to fight the infection. Inform your caregiver if you could be or are pregnant. Some medications used to treat the infection should not be taken during pregnancy.   Over-the-counter medications or creams to decrease itching or irritation may be recommended.   Your sexual partner will need to be treated if infected.  HOME CARE INSTRUCTIONS   Take all medication prescribed by your caregiver.   Take over-the-counter medication for itching or irritation as directed by your caregiver.   Do not have sexual intercourse while you have the infection.   Do not douche or wear tampons.   Discuss your infection with your partner, as your partner may have acquired the infection from you. Or, your partner may have been the person who transmitted the infection to you.   Have your sex partner examined and treated if necessary.   Practice safe, informed, and protected sex.   See your caregiver for other STD testing.  SEEK MEDICAL CARE IF:   You still have symptoms after you finish the medication.   You have an oral temperature above 102 F (38.9 C).   You develop belly (abdominal) pain.   You have pain when you urinate.   You have bleeding after sexual intercourse.   You develop a rash.   The medication makes you sick or makes you throw up (vomit).  Document Released: 06/12/2001 Document Revised: 12/06/2011 Document Reviewed: 07/08/2009 [facility] Patient Information 2012 [place], [place].

## 2012-04-29 NOTE — MAU Note (Signed)
Pt reports that in February she had an abortion and just started her cycle on 04/18/2012, pt states she had pain and irritation when she tried to use a tampon, after her cycle stopped she started having discharge "like yeast infection" and now she is having pain with urination

## 2012-04-30 LAB — GC/CHLAMYDIA PROBE AMP, GENITAL: Chlamydia, DNA Probe: NEGATIVE

## 2012-05-01 LAB — HERPES SIMPLEX VIRUS CULTURE

## 2012-05-21 ENCOUNTER — Inpatient Hospital Stay (HOSPITAL_COMMUNITY)
Admission: AD | Admit: 2012-05-21 | Discharge: 2012-05-22 | Disposition: A | Discharge status: Home / Self Care | Payer: BC Managed Care – PPO | Source: Ambulatory Visit | Attending: Obstetrics & Gynecology | Admitting: Obstetrics & Gynecology

## 2012-05-21 ENCOUNTER — Encounter (HOSPITAL_COMMUNITY): Payer: Self-pay | Admitting: *Deleted

## 2012-05-21 DIAGNOSIS — A5901 Trichomonal vulvovaginitis: Secondary | ICD-10-CM | POA: Insufficient documentation

## 2012-05-21 DIAGNOSIS — N949 Unspecified condition associated with female genital organs and menstrual cycle: Secondary | ICD-10-CM | POA: Insufficient documentation

## 2012-05-21 LAB — URINALYSIS, ROUTINE W REFLEX MICROSCOPIC
Bilirubin Urine: NEGATIVE
Nitrite: NEGATIVE
Specific Gravity, Urine: >1.03 — ABNORMAL HIGH (ref 1.005–1.030)
pH: 6 (ref 5.0–8.0)

## 2012-05-21 LAB — URINE MICROSCOPIC-ADD ON

## 2012-05-21 NOTE — MAU Note (Signed)
Pt LMP 05/09/2012, G2 P0, having vaginal discharge and discomfort.  Pt seen in MAU 3weeks ago dx with Trich and HSV.

## 2012-05-22 LAB — POCT PREGNANCY, URINE: Preg Test, Ur: NEGATIVE

## 2012-05-22 MED ORDER — METRONIDAZOLE 500 MG PO TABS
2000.0000 mg | ORAL_TABLET | ORAL | Status: AC
  Administered 2012-05-22: 2000 mg via ORAL
  Filled 2012-05-22: qty 4

## 2012-05-22 NOTE — MAU Provider Note (Signed)
History     CSN: [id number]  Arrival date and time: 05/21/12 2214   None    31 y.o.G2P0020 Chief Complaint  Patient presents with  . Vaginal Discharge   HPI Pt presents to MAU with yellow vaginal discharge continuing since diagnosis of trichomonas on 04/29/12.  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  No Flagyl was given or prescribed at that visit.  She has since cancelled her upcoming wedding because of her partner's infidelity and is not currently sexually active.  She denies abdominal pain, urinary symptoms, h/a, vaginal bleeding, dizziness, or n/v.    OB History    Grav Para Term Preterm Abortions TAB SAB Ect Mult Living   2    2 2           Past Medical History  Diagnosis Date  . Trichomonas   . No pertinent past medical history     Past Surgical History  Procedure Date  . Cholecystectomy     Family History  Problem Relation Age of Onset  . Hypertension Mother   . Heart disease Mother   . Diabetes Father   . Diabetes Maternal Grandmother     History  Substance Use Topics  . Smoking status: Never Smoker   . Smokeless tobacco: Not on file  . Alcohol Use: Yes    Allergies: No Known Allergies  Prescriptions prior to admission  Medication Sig Dispense Refill  . acetaminophen (TYLENOL) 500 MG tablet Take 500 mg by mouth every 6 (six) hours as needed. As needed for menstrual pain      . cetirizine (ZYRTEC) 10 MG tablet Take 10 mg by mouth daily. For allergies      . ibuprofen (ADVIL,MOTRIN) 400 MG tablet Take 400 mg by mouth every 6 (six) hours as needed. As needed for menstrual pain      . Multiple Vitamins-Calcium (ONE-A-DAY WOMENS PO) Take 1 tablet by mouth daily.      . valACYclovir (VALTREX) 1000 MG tablet Take 1 tablet (1,000 mg total) by mouth 2 (two) times daily.  20 tablet  0    Review of Systems  Constitutional: Negative for fever, chills and malaise/fatigue.    Eyes: Negative for blurred vision.  Respiratory: Negative for cough and shortness of breath.   Cardiovascular: Negative for chest pain.  Gastrointestinal: Negative for heartburn, nausea and vomiting.  Genitourinary: Negative for dysuria, urgency and frequency.       Vaginal discharge  Musculoskeletal: Negative.   Neurological: Negative for dizziness and headaches.  Psychiatric/Behavioral: Negative for depression.   Physical Exam   Blood pressure 118/61, pulse 80, temperature 98.6 F (37 C), temperature source Oral, resp. rate 18, height 5\' 6"  (1.676 m), weight 105.688 kg (233 lb), last menstrual period 05/09/2012.  Physical Exam  Nursing note and vitals reviewed. Constitutional: She is oriented to person, place, and time. She appears well-developed and well-nourished.  Neck: Normal range of motion.  Cardiovascular: Normal rate.   Respiratory: Effort normal.  GI: Soft.  Musculoskeletal: Normal range of motion.  Neurological: She is alert and oriented to person, place, and time.  Skin: Skin is warm and dry.  Psychiatric: She has a normal mood and affect. Her behavior is normal. Judgment and thought content normal.   Pelvic exam deferred.  GC/Chlamydia done in MAU on 04/29/12 and [facility].  Results for orders placed during the hospital encounter of 05/21/12 (from the  past 24 hour(s))  URINALYSIS, ROUTINE W REFLEX MICROSCOPIC     Status: Abnormal   Collection Time   05/21/12 10:35 PM      Component Value Range   Color, Urine YELLOW  YELLOW    APPearance CLEAR  CLEAR    Specific Gravity, Urine >1.030 (*) 1.005 - 1.030    pH 6.0  5.0 - 8.0    Glucose, UA NEGATIVE  NEGATIVE (mg/dL)   Hgb urine dipstick TRACE (*) NEGATIVE    Bilirubin Urine NEGATIVE  NEGATIVE    Ketones, ur NEGATIVE  NEGATIVE (mg/dL)   Protein, ur NEGATIVE  NEGATIVE (mg/dL)   Urobilinogen, UA 0.2  0.0 - 1.0 (mg/dL)   Nitrite NEGATIVE  NEGATIVE    Leukocytes, UA SMALL (*)  NEGATIVE   URINE MICROSCOPIC-ADD ON     Status: Abnormal   Collection Time   05/21/12 10:35 PM      Component Value Range   Squamous Epithelial / LPF RARE  RARE    WBC, UA 0-2  <3 (WBC/hpf)   RBC / HPF 7-10  <3 (RBC/hpf)   Bacteria, UA FEW (*) RARE    Urine-Other MUCOUS PRESENT    Trichomonas present in urine  MAU Course  Procedures U/a   Assessment and Plan  Trichomonas vaginalis  Flagyl 2g x1 dose in MAU D/C home Return to MAU as needed  LEFTWICH-KIRBY, Joeanthony Seeling 05/22/2012, 12:04 AM

## 2012-05-22 NOTE — Discharge Instructions (Signed)
Trichomoniasis  Trichomoniasis is an infection, caused by the Trichomonas organism, that affects both women and men. In women, the outer female genitalia and the vagina are affected. In men, the penis is mainly affected, but the prostate and other reproductive organs can also be involved. Trichomoniasis is a sexually transmitted disease (STD) and is most often passed to another person through sexual contact. The majority of people who get trichomoniasis do so from a sexual encounter and are also at risk for other STDs.  CAUSES    Sexual intercourse with an infected partner.   It can be present in swimming pools or hot tubs.  SYMPTOMS    Abnormal gray-green frothy vaginal discharge in women.   Vaginal itching and irritation in women.   Itching and irritation of the area outside the vagina in women.   Penile discharge with or without pain in males.   Inflammation of the urethra (urethritis), causing painful urination.   Bleeding after sexual intercourse.  RELATED COMPLICATIONS   Pelvic inflammatory disease.   Infection of the uterus (endometritis).   Infertility.   Tubal (ectopic) pregnancy.   It can be associated with other STDs, including gonorrhea and chlamydia, hepatitis B, and HIV.  COMPLICATIONS DURING PREGNANCY   Early (premature) delivery.   Premature rupture of the membranes (PROM).   Low birth weight.  DIAGNOSIS    Visualization of Trichomonas under the microscope from the vagina discharge.   Ph of the vagina greater than 4.5, tested with a test tape.   Trich Rapid Test.   Culture of the organism, but this is not usually needed.   It may be found on a Pap test.   Having a "strawberry cervix,"which means the cervix looks very red like a strawberry.  TREATMENT    You may be given medication to fight the infection. Inform your caregiver if you could be or are pregnant. Some medications used to treat the infection should not be taken during pregnancy.   Over-the-counter medications or  creams to decrease itching or irritation may be recommended.   Your sexual partner will need to be treated if infected.  HOME CARE INSTRUCTIONS    Take all medication prescribed by your caregiver.   Take over-the-counter medication for itching or irritation as directed by your caregiver.   Do not have sexual intercourse while you have the infection.   Do not douche or wear tampons.   Discuss your infection with your partner, as your partner may have acquired the infection from you. Or, your partner may have been the person who transmitted the infection to you.   Have your sex partner examined and treated if necessary.   Practice safe, informed, and protected sex.   See your caregiver for other STD testing.  SEEK MEDICAL CARE IF:    You still have symptoms after you finish the medication.   You have an oral temperature above 102 F (38.9 C).   You develop belly (abdominal) pain.   You have pain when you urinate.   You have bleeding after sexual intercourse.   You develop a rash.   The medication makes you sick or makes you throw up (vomit).  Document Released: 06/12/2001 Document Revised: 12/06/2011 Document Reviewed: 07/08/2009  [employer] Patient Information 2012 [employer].

## 2012-10-22 ENCOUNTER — Emergency Department (HOSPITAL_BASED_OUTPATIENT_CLINIC_OR_DEPARTMENT_OTHER): Payer: Self-pay

## 2012-10-22 ENCOUNTER — Encounter (HOSPITAL_BASED_OUTPATIENT_CLINIC_OR_DEPARTMENT_OTHER): Payer: Self-pay | Admitting: Emergency Medicine

## 2012-10-22 ENCOUNTER — Emergency Department (HOSPITAL_BASED_OUTPATIENT_CLINIC_OR_DEPARTMENT_OTHER)
Admission: EM | Admit: 2012-10-22 | Discharge: 2012-10-22 | Disposition: A | Discharge status: Home / Self Care | Payer: Self-pay | Attending: Emergency Medicine | Admitting: Emergency Medicine

## 2012-10-22 DIAGNOSIS — Z9889 Other specified postprocedural states: Secondary | ICD-10-CM | POA: Insufficient documentation

## 2012-10-22 DIAGNOSIS — R071 Chest pain on breathing: Secondary | ICD-10-CM | POA: Insufficient documentation

## 2012-10-22 DIAGNOSIS — F17200 Nicotine dependence, unspecified, uncomplicated: Secondary | ICD-10-CM | POA: Insufficient documentation

## 2012-10-22 DIAGNOSIS — R197 Diarrhea, unspecified: Secondary | ICD-10-CM | POA: Insufficient documentation

## 2012-10-22 DIAGNOSIS — R0789 Other chest pain: Secondary | ICD-10-CM

## 2012-10-22 DIAGNOSIS — Z8619 Personal history of other infectious and parasitic diseases: Secondary | ICD-10-CM | POA: Insufficient documentation

## 2012-10-22 LAB — CBC WITH DIFFERENTIAL/PLATELET
Basophils Relative: 0 % (ref 0–1)
Eosinophils Absolute: 0.1 10*3/uL (ref 0.0–0.7)
Lymphs Abs: 2.6 10*3/uL (ref 0.7–4.0)
MCH: 29 pg (ref 26.0–34.0)
Neutro Abs: 3.3 10*3/uL (ref 1.7–7.7)
Neutrophils Relative %: 49 % (ref 43–77)
Platelets: 388 10*3/uL (ref 150–400)
RBC: 4.21 MIL/uL (ref 3.87–5.11)

## 2012-10-22 LAB — BASIC METABOLIC PANEL
Chloride: 101 mEq/L (ref 96–112)
GFR calc Af Amer: >90 mL/min (ref >90)
GFR calc non Af Amer: >90 mL/min (ref >90)
Potassium: 3.5 mEq/L (ref 3.5–5.1)
Sodium: 138 mEq/L (ref 135–145)

## 2012-10-22 LAB — D-DIMER, QUANTITATIVE: D-Dimer, Quant: <0.27 ug/mL-FEU (ref 0.00–0.48)

## 2012-10-22 LAB — TROPONIN I: Troponin I: <0.3 ng/mL (ref <0.30)

## 2012-10-22 MED ORDER — TRAMADOL HCL 50 MG PO TABS: 50.0000 mg | ORAL_TABLET | Freq: Four times a day (QID) | ORAL | Status: DC | PRN

## 2012-10-22 MED ORDER — GI COCKTAIL ~~LOC~~
30.0000 mL | Freq: Once | ORAL | Status: AC
  Administered 2012-10-22: 30 mL via ORAL
  Filled 2012-10-22: qty 30

## 2012-10-22 NOTE — ED Provider Notes (Addendum)
History     CSN: [id number]  Arrival date & time 10/22/12  0053   First MD Initiated Contact with Patient 10/22/12 0146      Chief Complaint  Patient presents with  . Diarrhea  . Chest Pain    (Consider location/radiation/quality/duration/timing/severity/associated sxs/prior treatment) Patient is a 31 y.o. female presenting with diarrhea and chest pain. The history is provided by the patient.  Diarrhea The primary symptoms include diarrhea. Primary symptoms do not include fever, nausea or vomiting. The illness began today. The onset was gradual. The problem has not changed since onset. The illness does not include constipation. Associated medical issues do not include inflammatory bowel disease.  Chest Pain The chest pain began 5 - 7 days ago. Chest pain occurs frequently. The chest pain is unchanged. The pain is associated with breathing. The severity of the pain is severe. The quality of the pain is described as sharp. The pain does not radiate. Pertinent negatives for primary symptoms include no fever, no shortness of breath, no cough, no wheezing, no nausea and no vomiting.  Pertinent negatives for associated symptoms include no diaphoresis. She tried nothing for the symptoms. Risk factors include obesity.  Pertinent negatives for past medical history include no MI.  Procedure history is negative for cardiac catheterization.     Past Medical History  Diagnosis Date  . Trichomonas   . No pertinent past medical history     Past Surgical History  Procedure Date  . Cholecystectomy     Family History  Problem Relation Age of Onset  . Hypertension Mother   . Heart disease Mother   . Diabetes Father   . Diabetes Maternal Grandmother     History  Substance Use Topics  . Smoking status: Current Some Day Smoker  . Smokeless tobacco: Not on file  . Alcohol Use: Yes     wine occassioanlly    OB History    Grav Para Term Preterm Abortions TAB SAB Ect Mult Living   2     2 2           Review of Systems  Constitutional: Negative for fever and diaphoresis.  HENT: Negative for neck pain.   Respiratory: Negative for cough, chest tightness, shortness of breath and wheezing.   Cardiovascular: Positive for chest pain. Negative for leg swelling.  Gastrointestinal: Positive for diarrhea. Negative for nausea, vomiting and constipation.  All other systems reviewed and are negative.    Allergies  Review of patient's allergies indicates no known allergies.  Home Medications   Current Outpatient Rx  Name Route Sig Dispense Refill  . CETIRIZINE HCL 10 MG PO TABS Oral Take 10 mg by mouth daily. For allergies    . ONE-A-DAY WOMENS PO Oral Take 1 tablet by mouth daily.    . ACETAMINOPHEN 500 MG PO TABS Oral Take 500 mg by mouth every 6 (six) hours as needed. As needed for menstrual pain    . IBUPROFEN 400 MG PO TABS Oral Take 400 mg by mouth every 6 (six) hours as needed. As needed for menstrual pain    . VALACYCLOVIR HCL 1 G PO TABS Oral Take 1 tablet (1,000 mg total) by mouth 2 (two) times daily. 20 tablet 0    BP 122/78  Pulse 77  Temp 98.6 F (37 C) (Oral)  Resp 18  Ht 5\' 6"  (1.676 m)  Wt 225 lb (102.059 kg)  BMI 36.32 kg/m2  SpO2 99%  LMP 10/16/2012  Physical Exam  Constitutional: She  is oriented to person, place, and time. She appears well-developed and well-nourished. No distress.  HENT:  Head: Normocephalic and atraumatic.  Mouth/Throat: Oropharynx is clear and moist.  Eyes: Conjunctivae normal are normal. Pupils are equal, round, and reactive to light.  Neck: Normal range of motion. Neck supple.  Cardiovascular: Normal rate and regular rhythm.   Pulmonary/Chest: Effort normal and breath sounds normal. She has no wheezes. She has no rales. She exhibits tenderness.  Abdominal: Soft. Bowel sounds are normal. There is no tenderness. There is no rebound and no guarding.  Musculoskeletal: Normal range of motion. She exhibits no edema.    Neurological: She is alert and oriented to person, place, and time.  Skin: Skin is warm and dry.  Psychiatric: She has a normal mood and affect.    ED Course  Procedures (including critical care time)   Labs Reviewed  CBC WITH DIFFERENTIAL  BASIC METABOLIC PANEL  PREGNANCY, URINE  TROPONIN I  D-DIMER, QUANTITATIVE   No results found.   No diagnosis found.    MDM   Date: 10/22/2012  Rate: 82  Rhythm: normal sinus rhythm  QRS Axis: normal  Intervals: normal  ST/T Wave abnormalities: normal  Conduction Disutrbances: none  Narrative Interpretation: unremarkable    Negative ekg and troponin in the setting of > 8 hours of pain is sufficient to exclude ACS. Pain is reproducible and is consistent with MSK pain.  Return for chest pain shortness of breath or any concerns        Hektor Huston K Sy Saintjean-Rasch, MD 10/22/12 0427  Elliott Lasecki K Lanea Vankirk-Rasch, MD 10/22/12 [phone]

## 2012-10-22 NOTE — ED Notes (Signed)
Pt reports multiple episodes of diarrhea and nausea earlier today. Pt c/o midsternal chest pain tonight with "warm sensation" in left arm. Denies any shob or dizziness.

## 2013-02-01 ENCOUNTER — Encounter (HOSPITAL_COMMUNITY): Payer: Self-pay

## 2013-02-01 ENCOUNTER — Emergency Department (HOSPITAL_COMMUNITY)
Admission: EM | Admit: 2013-02-01 | Discharge: 2013-02-01 | Disposition: A | Discharge status: Home / Self Care | Payer: Self-pay | Attending: Emergency Medicine | Admitting: Emergency Medicine

## 2013-02-01 ENCOUNTER — Emergency Department (HOSPITAL_COMMUNITY): Payer: Self-pay

## 2013-02-01 DIAGNOSIS — N209 Urinary calculus, unspecified: Secondary | ICD-10-CM

## 2013-02-01 DIAGNOSIS — Z3202 Encounter for pregnancy test, result negative: Secondary | ICD-10-CM | POA: Insufficient documentation

## 2013-02-01 DIAGNOSIS — R300 Dysuria: Secondary | ICD-10-CM | POA: Insufficient documentation

## 2013-02-01 DIAGNOSIS — N200 Calculus of kidney: Secondary | ICD-10-CM | POA: Insufficient documentation

## 2013-02-01 DIAGNOSIS — F17200 Nicotine dependence, unspecified, uncomplicated: Secondary | ICD-10-CM | POA: Insufficient documentation

## 2013-02-01 DIAGNOSIS — N1330 Unspecified hydronephrosis: Secondary | ICD-10-CM | POA: Insufficient documentation

## 2013-02-01 DIAGNOSIS — N201 Calculus of ureter: Secondary | ICD-10-CM | POA: Insufficient documentation

## 2013-02-01 DIAGNOSIS — Z8619 Personal history of other infectious and parasitic diseases: Secondary | ICD-10-CM | POA: Insufficient documentation

## 2013-02-01 LAB — CBC
Hemoglobin: 11.8 g/dL — ABNORMAL LOW (ref 12.0–15.0)
MCH: 28.4 pg (ref 26.0–34.0)
MCHC: 33 g/dL (ref 30.0–36.0)
MCV: 86.3 fL (ref 78.0–100.0)
Platelets: 434 10*3/uL — ABNORMAL HIGH (ref 150–400)
RDW: 13.3 % (ref 11.5–15.5)
WBC: 6.1 10*3/uL (ref 4.0–10.5)

## 2013-02-01 LAB — URINE MICROSCOPIC-ADD ON

## 2013-02-01 LAB — URINALYSIS, ROUTINE W REFLEX MICROSCOPIC
Bilirubin Urine: NEGATIVE
Ketones, ur: NEGATIVE mg/dL
Nitrite: NEGATIVE
Protein, ur: NEGATIVE mg/dL
Urobilinogen, UA: 0.2 mg/dL (ref 0.0–1.0)

## 2013-02-01 LAB — BASIC METABOLIC PANEL
BUN: 8 mg/dL (ref 6–23)
CO2: 25 mEq/L (ref 19–32)
Calcium: 9.2 mg/dL (ref 8.4–10.5)
Chloride: 102 mEq/L (ref 96–112)
Creatinine, Ser: 0.82 mg/dL (ref 0.50–1.10)
GFR calc Af Amer: >90 mL/min (ref >90)
Glucose, Bld: 104 mg/dL — ABNORMAL HIGH (ref 70–99)

## 2013-02-01 MED ORDER — KETOROLAC TROMETHAMINE 10 MG PO TABS: 10.0000 mg | ORAL_TABLET | Freq: Four times a day (QID) | ORAL | Status: DC | PRN

## 2013-02-01 MED ORDER — KETOROLAC TROMETHAMINE 30 MG/ML IJ SOLN
30.0000 mg | Freq: Once | INTRAMUSCULAR | Status: AC
  Administered 2013-02-01: 30 mg via INTRAVENOUS
  Filled 2013-02-01: qty 1

## 2013-02-01 MED ORDER — MORPHINE SULFATE 4 MG/ML IJ SOLN
4.0000 mg | Freq: Once | INTRAMUSCULAR | Status: AC
  Administered 2013-02-01: 4 mg via INTRAVENOUS
  Filled 2013-02-01: qty 1

## 2013-02-01 MED ORDER — TAMSULOSIN HCL 0.4 MG PO CAPS: 0.4000 mg | ORAL_CAPSULE | Freq: Every day | ORAL | Status: DC

## 2013-02-01 MED ORDER — ONDANSETRON HCL 4 MG/2ML IJ SOLN
4.0000 mg | Freq: Once | INTRAMUSCULAR | Status: AC
  Administered 2013-02-01: 4 mg via INTRAVENOUS
  Filled 2013-02-01: qty 2

## 2013-02-01 MED ORDER — HYDROCODONE-ACETAMINOPHEN 5-325 MG PO TABS: 1.0000 | ORAL_TABLET | ORAL | Status: DC | PRN

## 2013-02-01 NOTE — ED Provider Notes (Signed)
History     CSN: [id number]  Arrival date & time 02/01/13  0730   First MD Initiated Contact with Patient 02/01/13 [phone]      Chief Complaint  Patient presents with  . Back Pain  . Dysuria   HPI (Consider location/radiation/quality/duration/timing/severity/associated sxs/prior treatment) Patient is a 32 y.o. female presenting with back pain and dysuria. The history is provided by the patient and a significant other.  Back Pain  Associated symptoms include dysuria.  Dysuria   32 year old female presents to emergency department with chief complaint of back pain and suprapubic pain.  Patient was at a bar last evening when she had sudden onset left-sided sharp flank pain that radiated into her left labia and groin.  She states that the pain at that time was 8/10.  She left the bar went home ate and went to sleep.  She was awoken from sleep around 6 this morning with the same left flank pain.  Pain is now constant, sharp, radiating into the groin.  She has associated suprapubic achy pain.  She denies a history of kidney stones.  She does have a mother and aunt with a history of kidney stones.  She has a distant history of urinary tract infection.  She denies fevers, chills, nausea, vomiting.  Denies hematuria or foul odor.  She has had frequency of urine.  Denies vaginal symptoms.   Past Medical History  Diagnosis Date  . Trichomonas   . No pertinent past medical history     Past Surgical History  Procedure Date  . Cholecystectomy     Family History  Problem Relation Age of Onset  . Hypertension Mother   . Heart disease Mother   . Diabetes Father   . Diabetes Maternal Grandmother     History  Substance Use Topics  . Smoking status: Current Some Day Smoker  . Smokeless tobacco: Not on file  . Alcohol Use: Yes     Comment: wine occassioanlly    OB History    Grav Para Term Preterm Abortions TAB SAB Ect Mult Living   2    2 2           Review of Systems  Genitourinary:  Positive for dysuria.  Musculoskeletal: Positive for back pain.  Ten systems reviewed and are negative for acute change, except as above and as noted in the HPI.    Allergies  Review of patient's allergies indicates no known allergies.  Home Medications   Current Outpatient Rx  Name  Route  Sig  Dispense  Refill  . ACETAMINOPHEN 500 MG PO TABS   Oral   Take 500 mg by mouth every 6 (six) hours as needed. As needed for menstrual pain         . CETIRIZINE HCL 10 MG PO TABS   Oral   Take 10 mg by mouth daily. For allergies         . IBUPROFEN 400 MG PO TABS   Oral   Take 400 mg by mouth every 6 (six) hours as needed. As needed for menstrual pain         . ONE-A-DAY WOMENS PO   Oral   Take 1 tablet by mouth daily.           BP 122/80  Pulse 95  Temp 98.3 F (36.8 C) (Oral)  Resp 16  SpO2 99%  LMP 01/26/2013  Physical Exam  Physical Exam  Nursing note and vitals reviewed. Constitutional: She is oriented to  person, place, and time. She appears well-developed and uncomfortable. HENT:  Head: Normocephalic and atraumatic.  Eyes: Conjunctivae normal and EOM are normal. Pupils are equal, round, and reactive to light. No scleral icterus.  Neck: Normal range of motion.  Cardiovascular: Normal rate, regular rhythm and normal heart sounds.  Exam reveals no gallop and no friction rub.   No murmur heard. Pulmonary/Chest: Effort normal and breath sounds normal. No respiratory distress.  Abdominal: Soft. Bowel sounds are normal. She exhibits no distension and no mass.  No  CVA angle tenderness.  Musculoskeletal: tender to deep palpation of the lumbar paraspinals and Quadratus Lumborum. Neurological: She is alert and oriented to person, place, and time.  Skin: Skin is warm and dry. She is not diaphoretic.    ED Course  Procedures (including critical care time)  Labs Reviewed  URINALYSIS, ROUTINE W REFLEX MICROSCOPIC - Abnormal; Notable for the following:    Hgb urine  dipstick LARGE (*)     All other components within normal limits  CBC - Abnormal; Notable for the following:    Hemoglobin 11.8 (*)     HCT 35.8 (*)     Platelets 434 (*)     All other components within normal limits  BASIC METABOLIC PANEL - Abnormal; Notable for the following:    Glucose, Bld 104 (*)     All other components within normal limits  URINE MICROSCOPIC-ADD ON - Abnormal; Notable for the following:    Bacteria, UA FEW (*)     All other components within normal limits  PREGNANCY, URINE   Ct Abdomen Pelvis Wo Contrast  02/01/2013  *RADIOLOGY REPORT*  Clinical Data: Left flank pain.  CT ABDOMEN AND PELVIS WITHOUT CONTRAST  Technique:  Multidetector CT imaging of the abdomen and pelvis was performed following the standard protocol without intravenous contrast.  Comparison: Ultrasound 05/19/2009  Findings: Visualized lung bases clear.  Vascular clips in the gallbladder fossa.  Unremarkable uninfused evaluation of the liver, spleen, adrenal glands, pancreas.  There is mild left hydronephrosis and ureterectasis down to the level of a 4 mm calculus, just proximal to the ureteral orifice. Tiny 1 mm calculi in the lower pole right renal collecting system. Unremarkable uninfused evaluation of the renal parenchyma.  Aorta normal in caliber.  Nondilated stomach, small bowel, and colon. Normal appendix.  Urinary bladder incompletely distended.  No ascites.  No free air.  Uterus and adnexal regions unremarkable except for a  dropped clip near the left ovary. No adenopathy.  IMPRESSION:  1.  4 mm distal left ureteral calculus with mild hydronephrosis. 2.  Right nephrolithiasis.   Original Report Authenticated By: D. Andria Rhein, MD      1. Urolithiasis   2. Nephrolithiasis       MDM  8:33 AM BP 122/80  Pulse 95  Temp 98.3 F (36.8 C) (Oral)  Resp 16  SpO2 99%  LMP 01/26/2013 Patient with PE hx concerning for kidney stone. DDX includes musculoskeletal and UTI. Labs are  pending.   10:39 AM Patient with hematuria, Nephro/uroliothiasis.  Pain is controlled. WIll d/c with pain contorol. Strainer, flomax and urology f/u.  Care plan discussed.  Reasons to return discussed.      Arthor Captain, PA-C 02/01/13 1042

## 2013-02-01 NOTE — ED Provider Notes (Signed)
Medical screening examination/treatment/procedure(s) were performed by non-physician practitioner and as supervising physician I was immediately available for consultation/collaboration.   Lyanne Co, MD 02/01/13 1051

## 2013-02-01 NOTE — ED Notes (Signed)
Patient transported to CT via stretcher.

## 2013-02-01 NOTE — ED Notes (Signed)
Pt states over the past 24hrs she has been having lower back pain and has now moved to her lower abdomen and groin area as well. Denies any difficulty urinating but has had some urgency and feeling of not completely emptying her bladder. Pain is a 7 on pain scale. A sharp throbbing intermit pain.

## 2013-02-01 NOTE — ED Notes (Addendum)
Pt c/o lower back pain radiating into groin area and dysuria.  Pain score 8/10.  Denies injury.  Denies numbness and tingling.

## 2013-02-17 ENCOUNTER — Encounter (HOSPITAL_COMMUNITY): Payer: Self-pay

## 2013-02-17 ENCOUNTER — Emergency Department (HOSPITAL_COMMUNITY)
Admission: EM | Admit: 2013-02-17 | Discharge: 2013-02-17 | Disposition: A | Discharge status: Home / Self Care | Payer: Self-pay | Attending: Emergency Medicine | Admitting: Emergency Medicine

## 2013-02-17 DIAGNOSIS — K602 Anal fissure, unspecified: Secondary | ICD-10-CM | POA: Insufficient documentation

## 2013-02-17 DIAGNOSIS — K625 Hemorrhage of anus and rectum: Secondary | ICD-10-CM | POA: Insufficient documentation

## 2013-02-17 DIAGNOSIS — K921 Melena: Secondary | ICD-10-CM | POA: Insufficient documentation

## 2013-02-17 DIAGNOSIS — Z87442 Personal history of urinary calculi: Secondary | ICD-10-CM | POA: Insufficient documentation

## 2013-02-17 DIAGNOSIS — R079 Chest pain, unspecified: Secondary | ICD-10-CM | POA: Insufficient documentation

## 2013-02-17 DIAGNOSIS — Z8619 Personal history of other infectious and parasitic diseases: Secondary | ICD-10-CM | POA: Insufficient documentation

## 2013-02-17 DIAGNOSIS — E669 Obesity, unspecified: Secondary | ICD-10-CM | POA: Insufficient documentation

## 2013-02-17 DIAGNOSIS — K600 Acute anal fissure: Secondary | ICD-10-CM

## 2013-02-17 DIAGNOSIS — F17200 Nicotine dependence, unspecified, uncomplicated: Secondary | ICD-10-CM | POA: Insufficient documentation

## 2013-02-17 HISTORY — DX: Calculus of kidney: N20.0

## 2013-02-17 LAB — CBC WITH DIFFERENTIAL/PLATELET
Basophils Absolute: 0 10*3/uL (ref 0.0–0.1)
Basophils Relative: 0 % (ref 0–1)
Eosinophils Relative: 0 % (ref 0–5)
Lymphocytes Relative: 34 % (ref 12–46)
MCHC: 33.2 g/dL (ref 30.0–36.0)
Monocytes Absolute: 0.6 10*3/uL (ref 0.1–1.0)
Neutro Abs: 3.9 10*3/uL (ref 1.7–7.7)
Platelets: 393 10*3/uL (ref 150–400)
RDW: 13.4 % (ref 11.5–15.5)
WBC: 6.8 10*3/uL (ref 4.0–10.5)

## 2013-02-17 LAB — COMPREHENSIVE METABOLIC PANEL
Alkaline Phosphatase: 77 U/L (ref 39–117)
BUN: 9 mg/dL (ref 6–23)
GFR calc Af Amer: >90 mL/min (ref >90)
Glucose, Bld: 112 mg/dL — ABNORMAL HIGH (ref 70–99)
Potassium: 3.7 mEq/L (ref 3.5–5.1)
Sodium: 134 mEq/L — ABNORMAL LOW (ref 135–145)
Total Bilirubin: 0.2 mg/dL — ABNORMAL LOW (ref 0.3–1.2)
Total Protein: 7.8 g/dL (ref 6.0–8.3)

## 2013-02-17 LAB — POCT I-STAT TROPONIN I: Troponin i, poc: 0 ng/mL (ref 0.00–0.08)

## 2013-02-17 NOTE — ED Provider Notes (Signed)
History     CSN: [id number]  Arrival date & time 02/17/13  1731   First MD Initiated Contact with Patient 02/17/13 1818      Chief Complaint  Patient presents with  . Chest Pain  . Rectal Bleeding    (Consider location/radiation/quality/duration/timing/severity/associated sxs/prior treatment) HPI Comments: Bonnie Carr is a 32 y.o. Female who has had intermittent chest pain for about 10 days. She has episodes of sharp, central chest pain, without radiation that lasts from seconds to minutes. It tends to improve when she belches. She denies fever, chills, cough, shortness of breath, abdominal pain, nausea, vomiting, diarrhea, or constipation. She has noted blood on toilet tissue after wiping, following a bowel movement for 3 days. She denies hemorrhoids. She was recently treated for an obstructing ureteral stone, but feels like she has passed. She only used a few doses of narcotic pain medicine. She did not see a urologist, for followup due to financial difficulties. There are no modifying factors.  Patient is a 32 y.o. female presenting with chest pain and hematochezia. The history is provided by the patient.  Chest Pain Rectal Bleeding  Associated symptoms include chest pain.    Past Medical History  Diagnosis Date  . Trichomonas   . No pertinent past medical history   . Kidney stones     Past Surgical History  Procedure Laterality Date  . Cholecystectomy      Family History  Problem Relation Age of Onset  . Hypertension Mother   . Heart disease Mother   . Diabetes Father   . Diabetes Maternal Grandmother     History  Substance Use Topics  . Smoking status: Current Some Day Smoker  . Smokeless tobacco: Never Used  . Alcohol Use: Yes     Comment: wine occassioanlly    OB History   Grav Para Term Preterm Abortions TAB SAB Ect Mult Living   2    2 2           Review of Systems  Cardiovascular: Positive for chest pain.  Gastrointestinal: Positive for  hematochezia.  All other systems reviewed and are negative.    Allergies  Review of patient's allergies indicates no known allergies.  Home Medications   Current Outpatient Rx  Name  Route  Sig  Dispense  Refill  . cetirizine (ZYRTEC) 10 MG tablet   Oral   Take 10 mg by mouth daily. For allergies         . HYDROcodone-acetaminophen (NORCO/VICODIN) 5-325 MG per tablet   Oral   Take 1 tablet by mouth every 4 (four) hours as needed for pain.   10 tablet   0   . ketorolac (TORADOL) 10 MG tablet   Oral   Take 1 tablet (10 mg total) by mouth every 6 (six) hours as needed for pain.   20 tablet   0   . Multiple Vitamin (MULTIVITAMIN WITH MINERALS) TABS   Oral   Take 1 tablet by mouth daily.           BP 119/68  Pulse 79  Temp(Src) 97.9 F (36.6 C) (Oral)  Resp 24  SpO2 100%  LMP 01/26/2013  Physical Exam  Nursing note and vitals reviewed. Constitutional: She is oriented to person, place, and time. She appears well-developed and well-nourished.  OBESE  HENT:  Head: Normocephalic and atraumatic.  Eyes: Conjunctivae and EOM are normal. Pupils are equal, round, and reactive to light.  Neck: Normal range of motion and phonation normal.  Neck supple.  Cardiovascular: Normal rate, regular rhythm and intact distal pulses.   Pulmonary/Chest: Effort normal and breath sounds normal. She exhibits no tenderness.  Abdominal: Soft. She exhibits no distension. There is no tenderness. There is no guarding.  Genitourinary:  Small posterior anal fissure, not bleeding. No associated hemorrhoids or swelling  Musculoskeletal: Normal range of motion.  Neurological: She is alert and oriented to person, place, and time. She has normal strength. She exhibits normal muscle tone.  Skin: Skin is warm and dry.  Psychiatric: She has a normal mood and affect. Her behavior is normal. Judgment and thought content normal.    ED Course  Procedures (including critical care time)    Date:  10/17/2012  Rate: 79  Rhythm: normal sinus rhythm  QRS Axis: normal  PR and QT Intervals: normal  ST/T Wave abnormalities: normal  PR and QRS Conduction Disutrbances:none  Narrative Interpretation:   Old EKG Reviewed: unchanged      Labs Reviewed  COMPREHENSIVE METABOLIC PANEL - Abnormal; Notable for the following:    Sodium 134 (*)    Glucose, Bld 112 (*)    Total Bilirubin 0.2 (*)    All other components within normal limits  CBC WITH DIFFERENTIAL  POCT I-STAT TROPONIN I   Nursing notes, applicable records and vitals reviewed.  Radiologic Images/Reports reviewed.    1. Chest pain   2. Acute anal fissure       MDM  Nonspecific chest pain, with negative ED evaluation. Doubt ACS, PE, pneumonia, or musculoskeletal chest wall pain, possible GI related, with the improvement with belching. No indication for further assessment or aggressive treatment. Her rectal bleeding is secondary to a anal fissure, likely improve with time, and local wound care. Further assessment of that problem is not currently necessary     Plan: Home Medications- Tylenol for pain; Home Treatments- wound care of anal fissure; Recommended follow up- PCP prn        Flint Melter, MD 02/18/13 0100

## 2013-02-17 NOTE — ED Notes (Signed)
Patient reports having recurrent intermittent chest pain x 1 1/2 weeks. Patient states she had CP in October and tests were all negative. Patient states that at times the CP goes away when she burps and passes gas. Patient states she has been having bright red blood with occasional clots on the toilet paper for the past 3 days.

## 2013-06-14 ENCOUNTER — Emergency Department (HOSPITAL_BASED_OUTPATIENT_CLINIC_OR_DEPARTMENT_OTHER)
Admission: EM | Admit: 2013-06-14 | Discharge: 2013-06-14 | Disposition: A | Discharge status: Home / Self Care | Payer: Managed Care, Other (non HMO) | Attending: Emergency Medicine | Admitting: Emergency Medicine

## 2013-06-14 ENCOUNTER — Encounter (HOSPITAL_BASED_OUTPATIENT_CLINIC_OR_DEPARTMENT_OTHER): Payer: Self-pay | Admitting: *Deleted

## 2013-06-14 DIAGNOSIS — F17200 Nicotine dependence, unspecified, uncomplicated: Secondary | ICD-10-CM | POA: Insufficient documentation

## 2013-06-14 DIAGNOSIS — A599 Trichomoniasis, unspecified: Secondary | ICD-10-CM | POA: Insufficient documentation

## 2013-06-14 DIAGNOSIS — N898 Other specified noninflammatory disorders of vagina: Secondary | ICD-10-CM | POA: Insufficient documentation

## 2013-06-14 DIAGNOSIS — R197 Diarrhea, unspecified: Secondary | ICD-10-CM | POA: Insufficient documentation

## 2013-06-14 DIAGNOSIS — Z87442 Personal history of urinary calculi: Secondary | ICD-10-CM | POA: Insufficient documentation

## 2013-06-14 DIAGNOSIS — Z3202 Encounter for pregnancy test, result negative: Secondary | ICD-10-CM | POA: Insufficient documentation

## 2013-06-14 HISTORY — DX: Calculus of kidney: N20.0

## 2013-06-14 LAB — WET PREP, GENITAL

## 2013-06-14 LAB — URINALYSIS, ROUTINE W REFLEX MICROSCOPIC
Bilirubin Urine: NEGATIVE
Hgb urine dipstick: NEGATIVE
Protein, ur: NEGATIVE mg/dL
Urobilinogen, UA: 0.2 mg/dL (ref 0.0–1.0)

## 2013-06-14 LAB — URINE MICROSCOPIC-ADD ON

## 2013-06-14 MED ORDER — METRONIDAZOLE 500 MG PO TABS
2000.0000 mg | ORAL_TABLET | Freq: Once | ORAL | Status: AC
  Administered 2013-06-14: 2000 mg via ORAL
  Filled 2013-06-14: qty 4

## 2013-06-14 MED ORDER — LIDOCAINE HCL (PF) 1 % IJ SOLN
INTRAMUSCULAR | Status: AC
  Administered 2013-06-14: 0.9 mL
  Filled 2013-06-14: qty 5

## 2013-06-14 MED ORDER — AZITHROMYCIN 250 MG PO TABS
1000.0000 mg | ORAL_TABLET | Freq: Once | ORAL | Status: AC
  Administered 2013-06-14: 1000 mg via ORAL
  Filled 2013-06-14: qty 4

## 2013-06-14 MED ORDER — ONDANSETRON 4 MG PO TBDP
4.0000 mg | ORAL_TABLET | Freq: Once | ORAL | Status: AC
  Administered 2013-06-14: 4 mg via ORAL
  Filled 2013-06-14: qty 1

## 2013-06-14 MED ORDER — CEFTRIAXONE SODIUM 250 MG IJ SOLR
250.0000 mg | Freq: Once | INTRAMUSCULAR | Status: AC
  Administered 2013-06-14: 250 mg via INTRAMUSCULAR
  Filled 2013-06-14: qty 250

## 2013-06-14 NOTE — ED Notes (Signed)
MD at bedside. 

## 2013-06-14 NOTE — ED Notes (Addendum)
C/o lower back pain that started 2 days ago that radiates into her lower abdomen. Denies any fevers. Also c/o of vaginal d/c that started today. C/o burning with urination. Denies any other symptoms.  States hx of kidney stones.

## 2013-06-14 NOTE — ED Provider Notes (Signed)
History  This chart was scribed for Bonnie Carr B. Bernette Mayers, MD by Manuela Schwartz, ED scribe. This patient was seen in room MH07/MH07 and the patient's care was started at 1942.   CSN: [id number]  Arrival date & time 06/14/13  0865   First MD Initiated Contact with Patient 06/14/13 2150      Chief Complaint  Patient presents with  . Back Pain   The history is provided by the patient. No language interpreter was used.   Bonnie Carr is a 32 y.o. female who presents to the Emergency Department complaining of lower back pain for the last several days associated with vaginal discharge today. She has had some diarrhea chronically and may have seen some blood a few days ago. Pain is not localized to one side. Some nausea, no vomiting or fever. No dysuria.     Past Medical History  Diagnosis Date  . Trichomonas   . No pertinent past medical history   . Kidney stones   . Kidney stone     Past Surgical History  Procedure Laterality Date  . Cholecystectomy      Family History  Problem Relation Age of Onset  . Hypertension Mother   . Heart disease Mother   . Diabetes Father   . Diabetes Maternal Grandmother     History  Substance Use Topics  . Smoking status: Current Some Day Smoker  . Smokeless tobacco: Never Used  . Alcohol Use: Yes     Comment: wine occassioanlly    OB History   Grav Para Term Preterm Abortions TAB SAB Ect Mult Living   2    2 2           Review of Systems A complete 10 system review of systems was obtained and all systems are negative except as noted in the HPI and PMH.   Allergies  Review of patient's allergies indicates no known allergies.  Home Medications   Current Outpatient Rx  Name  Route  Sig  Dispense  Refill  . cetirizine (ZYRTEC) 10 MG tablet   Oral   Take 10 mg by mouth daily. For allergies         . HYDROcodone-acetaminophen (NORCO/VICODIN) 5-325 MG per tablet   Oral   Take 1 tablet by mouth every 4 (four) hours as needed for  pain.   10 tablet   0   . ketorolac (TORADOL) 10 MG tablet   Oral   Take 1 tablet (10 mg total) by mouth every 6 (six) hours as needed for pain.   20 tablet   0   . Multiple Vitamin (MULTIVITAMIN WITH MINERALS) TABS   Oral   Take 1 tablet by mouth daily.           Triage Vitals: BP 121/68  Pulse 80  Temp(Src) 99 F (37.2 C) (Oral)  Resp 18  Ht 5\' 7"  (1.702 m)  Wt 215 lb (97.523 kg)  BMI 33.67 kg/m2  SpO2 100%  LMP 06/08/2013  Physical Exam  Nursing note and vitals reviewed. Constitutional: She is oriented to person, place, and time. She appears well-developed and well-nourished.  HENT:  Head: Normocephalic and atraumatic.  Eyes: EOM are normal. Pupils are equal, round, and reactive to light.  Neck: Normal range of motion. Neck supple.  Cardiovascular: Normal rate, normal heart sounds and intact distal pulses.   Pulmonary/Chest: Effort normal and breath sounds normal.  Abdominal: Bowel sounds are normal. She exhibits no distension. There is no tenderness.  Genitourinary:  Frothy white discharge, no CMT, adnexal tenderness or mass  Musculoskeletal: Normal range of motion. She exhibits no edema and no tenderness.  Neurological: She is alert and oriented to person, place, and time. She has normal strength. No cranial nerve deficit or sensory deficit.  Skin: Skin is warm and dry. No rash noted.  Psychiatric: She has a normal mood and affect.    ED Course  Procedures (including critical care time) DIAGNOSTIC STUDIES: Oxygen Saturation is 100% on room air, normal by my interpretation.    COORDINATION OF CARE: At 1000 PM Discussed treatment plan with patient which includes UA. Patient agrees.   Labs Reviewed  URINALYSIS, ROUTINE W REFLEX MICROSCOPIC - Abnormal; Notable for the following:    APPearance CLOUDY (*)    Leukocytes, UA LARGE (*)    All other components within normal limits  URINE MICROSCOPIC-ADD ON - Abnormal; Notable for the following:    Squamous  Epithelial / LPF MANY (*)    Bacteria, UA MANY (*)    All other components within normal limits  URINE CULTURE  PREGNANCY, URINE   No results found.   1. Trichomonas       MDM  I personally performed the services described in this documentation, which was scribed in my presence. The recorded information has been reviewed and is accurate.   UA is contaminated, doubt UTI given symptoms consistent with trichomonas. Pt informed of results including STD. Treated empirically for GC/C as well. Advised to followup with PCP and inform sexual partners.          Elfriede Bonini B. Bernette Mayers, MD 06/14/13 2314

## 2013-06-15 LAB — GC/CHLAMYDIA PROBE AMP: CT Probe RNA: NEGATIVE

## 2013-06-16 LAB — URINE CULTURE

## 2013-06-17 NOTE — Progress Notes (Signed)
ED Antimicrobial Stewardship Positive Culture Follow Up   Bonnie Carr is an 32 y.o. female who presented to [facility] on 06/14/2013 with a chief complaint of lower back pain, vaginal discharge, and burning with urination  Chief Complaint  Patient presents with  . Back Pain    Recent Results (from the past 720 hour(s))  URINE CULTURE     Status: None   Collection Time    06/14/13  8:10 PM      Result Value Range Status   Specimen Description URINE, CLEAN CATCH   Final   Special Requests NONE   Final   Culture  Setup Time 06/15/2013 08:46   Final   Colony Count 70,000 COLONIES/ML   Final   Culture     Final   Value: GROUP B STREP(S.AGALACTIAE)ISOLATED     Note: TESTING AGAINST S. AGALACTIAE NOT ROUTINELY PERFORMED DUE TO PREDICTABILITY OF AMP/PEN/VAN SUSCEPTIBILITY.   Report Status 06/16/2013 FINAL   Final  WET PREP, GENITAL     Status: Abnormal   Collection Time    06/14/13 10:22 PM      Result Value Range Status   Yeast Wet Prep HPF POC NONE SEEN  NONE SEEN Final   Trich, Wet Prep MANY (*) NONE SEEN Final   Clue Cells Wet Prep HPF POC FEW (*) NONE SEEN Final   WBC, Wet Prep HPF POC MANY (*) NONE SEEN Final  GC/CHLAMYDIA PROBE AMP     Status: None   Collection Time    06/14/13 10:22 PM      Result Value Range Status   CT Probe RNA NEGATIVE  NEGATIVE Final   GC Probe RNA NEGATIVE  NEGATIVE Final   Comment: (NOTE)                                                                                              **Normal Reference Range: Negative**          Assay performed using the Gen-Probe APTIMA COMBO2 (R) Assay.     Acceptable specimen types for this assay include APTIMA Swabs (Unisex,     endocervical, urethral, or vaginal), first void urine, and ThinPrep     liquid based cytology samples.    []  Treated with , organism resistant to prescribed antimicrobial []  Patient discharged originally without antimicrobial agent and treatment is now indicated  The patient was  treated for cervicitis and trichomonas in the [facility] with Azithromycin, Rocephin, and Flagyl. Group B Strep in [facility] is likely colonizer and not true pathogen. Discussed with ED PA -- no treatment deemed necessary.  New antibiotic prescription: No treatment needed  ED Provider: Antony Madura, PA-C  [name] 06/17/2013, 9:58 AM Infectious Diseases Pharmacist Phone# [phone]

## 2013-06-17 NOTE — ED Notes (Signed)
Post ED Visit - Positive Culture Follow-up  Culture report reviewed by antimicrobial stewardship pharmacist: []  Wes Dulaney, Pharm.D., BCPS []  Celedonio Miyamoto, Pharm.D., BCPS [x]  Georgina Pillion, Pharm.D., BCPS []  [place], [address].D., BCPS, AAHIVP []  Estella Husk, Pharm.D., BCPS, AAHIVP  Positive Urine culture  no further patient follow-up is required at this time, likely colonized.  Larena Sox 06/17/2013, 3:13 PM

## 2013-11-12 ENCOUNTER — Encounter (HOSPITAL_COMMUNITY): Payer: Self-pay | Admitting: Emergency Medicine

## 2013-11-12 ENCOUNTER — Emergency Department (HOSPITAL_COMMUNITY): Payer: BC Managed Care – PPO

## 2013-11-12 ENCOUNTER — Emergency Department (HOSPITAL_COMMUNITY)
Admission: EM | Admit: 2013-11-12 | Discharge: 2013-11-12 | Disposition: A | Discharge status: Home / Self Care | Payer: BC Managed Care – PPO | Attending: Emergency Medicine | Admitting: Emergency Medicine

## 2013-11-12 DIAGNOSIS — Z79899 Other long term (current) drug therapy: Secondary | ICD-10-CM | POA: Insufficient documentation

## 2013-11-12 DIAGNOSIS — Z3202 Encounter for pregnancy test, result negative: Secondary | ICD-10-CM | POA: Insufficient documentation

## 2013-11-12 DIAGNOSIS — Z8619 Personal history of other infectious and parasitic diseases: Secondary | ICD-10-CM | POA: Insufficient documentation

## 2013-11-12 DIAGNOSIS — Z87442 Personal history of urinary calculi: Secondary | ICD-10-CM | POA: Insufficient documentation

## 2013-11-12 DIAGNOSIS — F17200 Nicotine dependence, unspecified, uncomplicated: Secondary | ICD-10-CM | POA: Insufficient documentation

## 2013-11-12 DIAGNOSIS — R42 Dizziness and giddiness: Secondary | ICD-10-CM | POA: Insufficient documentation

## 2013-11-12 DIAGNOSIS — Z7982 Long term (current) use of aspirin: Secondary | ICD-10-CM | POA: Insufficient documentation

## 2013-11-12 DIAGNOSIS — R079 Chest pain, unspecified: Secondary | ICD-10-CM | POA: Insufficient documentation

## 2013-11-12 LAB — BASIC METABOLIC PANEL
CO2: 24 mEq/L (ref 19–32)
Calcium: 10 mg/dL (ref 8.4–10.5)
Chloride: 100 mEq/L (ref 96–112)
GFR calc Af Amer: >90 mL/min (ref >90)
Sodium: 135 mEq/L (ref 135–145)

## 2013-11-12 LAB — POCT I-STAT TROPONIN I: Troponin i, poc: 0 ng/mL (ref 0.00–0.08)

## 2013-11-12 LAB — CBC
MCH: 28.1 pg (ref 26.0–34.0)
Platelets: 459 10*3/uL — ABNORMAL HIGH (ref 150–400)
RBC: 4.27 MIL/uL (ref 3.87–5.11)
WBC: 9.1 10*3/uL (ref 4.0–10.5)

## 2013-11-12 MED ORDER — ASPIRIN 81 MG PO CHEW
81.0000 mg | CHEWABLE_TABLET | Freq: Once | ORAL | Status: AC
  Administered 2013-11-12: 81 mg via ORAL
  Filled 2013-11-12: qty 1

## 2013-11-12 MED ORDER — ASPIRIN 81 MG PO CHEW: 81.0000 mg | CHEWABLE_TABLET | Freq: Every day | ORAL | Status: DC

## 2013-11-12 NOTE — ED Provider Notes (Signed)
CSN: [id number]     Arrival date & time 11/12/13  0039 History   First MD Initiated Contact with Patient 11/12/13 0136     Chief Complaint  Patient presents with  . Chest Pain   (Consider location/radiation/quality/duration/timing/severity/associated sxs/prior Treatment) HPI Comments: States that she's had left chest pain.  Under her breasts for the past 3, days, intermittently.  On day one, she had one episode of dizziness.  That stopped with rest.  She can give a timeframe or precipitating cause for her chest pain, not associated with nausea, or shortness of breath.  Yesterday, and, today.  She's had several episodes tonight episode radiated to her left arm.  It lasted approximately one hour. She currently does not have primary care physician.  She does not use any kind of hormone birth control.  Does not travel.  She does smoke.  Daily.  She has a sedentary lifestyle, but has not traveled recently.  Denies any swelling in her legs or history of DVTs.  She does report that her mother had a heart attack at an early age in her 52s.  They related this to a heart murmur.  Patient is a 32 y.o. female presenting with chest pain. The history is provided by the patient.  Chest Pain Pain location:  Substernal area and L chest Pain quality: stabbing   Pain radiates to:  L shoulder and L arm Pain radiates to the back: no   Pain severity:  Moderate Onset quality:  Gradual Duration:  3 days Timing:  Intermittent Progression:  Waxing and waning Chronicity:  Recurrent Context: at rest and stress   Context: not breathing, no drug use, not eating, no intercourse, not lifting, not raising an arm and no trauma   Relieved by:  None tried Worsened by:  Nothing tried Ineffective treatments:  None tried Associated symptoms: dizziness   Associated symptoms: no cough, no fatigue, no fever, no headache, no heartburn, no lower extremity edema, no nausea, no numbness, no palpitations, no shortness of breath, no  syncope and not vomiting     Past Medical History  Diagnosis Date  . Trichomonas   . No pertinent past medical history   . Kidney stones   . Kidney stone    Past Surgical History  Procedure Laterality Date  . Cholecystectomy     Family History  Problem Relation Age of Onset  . Hypertension Mother   . Heart disease Mother   . Diabetes Father   . Diabetes Maternal Grandmother    History  Substance Use Topics  . Smoking status: Current Some Day Smoker  . Smokeless tobacco: Never Used  . Alcohol Use: Yes     Comment: wine occassioanlly   OB History   Grav Para Term Preterm Abortions TAB SAB Ect Mult Living   2    2 2          Review of Systems  Constitutional: Negative for fever and fatigue.  Respiratory: Negative for cough and shortness of breath.   Cardiovascular: Positive for chest pain. Negative for palpitations, leg swelling and syncope.  Gastrointestinal: Negative for heartburn, nausea and vomiting.  Musculoskeletal: Negative for neck pain.  Neurological: Positive for dizziness. Negative for numbness and headaches.  All other systems reviewed and are negative.    Allergies  Review of patient's allergies indicates no known allergies.  Home Medications   Current Outpatient Rx  Name  Route  Sig  Dispense  Refill  . cetirizine (ZYRTEC) 10 MG tablet  Oral   Take 10 mg by mouth daily. For allergies         . ibuprofen (ADVIL,MOTRIN) 200 MG tablet   Oral   Take 600 mg by mouth every 6 (six) hours as needed for cramping.         Marland Kitchen aspirin 81 MG chewable tablet   Oral   Chew 1 tablet (81 mg total) by mouth daily.   30 tablet   0    BP 134/77  Temp(Src) 98.2 F (36.8 C) (Oral)  Resp 23  SpO2 99%  LMP 11/05/2013 Physical Exam  Nursing note and vitals reviewed. Constitutional: She is oriented to person, place, and time. She appears well-developed and well-nourished.  HENT:  Head: Normocephalic.  Eyes: Pupils are equal, round, and reactive to  light.  Neck: Normal range of motion.  Cardiovascular: Normal rate, regular rhythm and normal heart sounds.   No murmur heard. Pulmonary/Chest: Effort normal and breath sounds normal.  Abdominal: Soft. Bowel sounds are normal.  Musculoskeletal: Normal range of motion.  Neurological: She is alert and oriented to person, place, and time.  Skin: Skin is warm and dry.    ED Course  Procedures (including critical care time) Labs Review Labs Reviewed  CBC - Abnormal; Notable for the following:    Platelets 459 (*)    All other components within normal limits  BASIC METABOLIC PANEL - Abnormal; Notable for the following:    Potassium 3.3 (*)    All other components within normal limits  POCT I-STAT TROPONIN I  POCT PREGNANCY, URINE   Imaging Review Dg Chest 2 View  11/12/2013   CLINICAL DATA:  Chest pain for 3 days.  EXAM: CHEST  2 VIEW  COMPARISON:  Chest radiograph performed 10/22/2012  FINDINGS: The lungs are well-aerated and clear. There is no evidence of focal opacification, pleural effusion or pneumothorax.  The heart is normal in size; the mediastinal contour is within normal limits. No acute osseous abnormalities are seen. Clips are noted within the right upper quadrant, reflecting prior cholecystectomy.  IMPRESSION: No acute cardiopulmonary process seen.   Electronically Signed   By: Roanna Raider M.D.   On: 11/12/2013 02:06    EKG Interpretation     Ventricular Rate:  88 PR Interval:  150 QRS Duration: 83 QT Interval:  349 QTC Calculation: 422 R Axis:   42 Text Interpretation:  Sinus rhythm No significant change since last tracing            MDM   1. Nonspecific chest pain     I discussed this patient with Dr. on her.  Reviewed.  Labs, EKG, risk factors.  Patient will be discharged home with cardiology referral   Arman Filter, NP 11/12/13 [phone]

## 2013-11-12 NOTE — ED Notes (Signed)
Pt complains of central chest pain that radiates under her breast for two days

## 2013-11-12 NOTE — ED Notes (Signed)
Pt unable to void at this time. 

## 2013-11-12 NOTE — ED Provider Notes (Signed)
Medical screening examination/treatment/procedure(s) were performed by non-physician practitioner and as supervising physician I was immediately available for consultation/collaboration.  EKG Interpretation     Ventricular Rate:  88 PR Interval:  150 QRS Duration: 83 QT Interval:  349 QTC Calculation: 422 R Axis:   42 Text Interpretation:  Sinus rhythm No significant change since last tracing             Olivia Mackie, MD 11/12/13 [phone]

## 2013-12-04 ENCOUNTER — Encounter: Payer: Self-pay | Admitting: Internal Medicine

## 2013-12-04 ENCOUNTER — Ambulatory Visit (INDEPENDENT_AMBULATORY_CARE_PROVIDER_SITE_OTHER): Payer: BC Managed Care – PPO | Admitting: Internal Medicine

## 2013-12-04 VITALS — BP 112/80 | HR 86 | Ht 66.0 in | Wt 261.0 lb

## 2013-12-04 DIAGNOSIS — Z0000 Encounter for general adult medical examination without abnormal findings: Secondary | ICD-10-CM

## 2013-12-04 LAB — LIPID PANEL
Cholesterol: 152 mg/dL (ref 0–200)
HDL: 34.2 mg/dL — ABNORMAL LOW (ref >39.00)
VLDL: 22 mg/dL (ref 0.0–40.0)

## 2013-12-04 NOTE — Patient Instructions (Signed)
Your physician recommends that you schedule a follow-up appointment in: as needed basis  Your physician recommends that you get lab work today ( lipid )

## 2013-12-04 NOTE — Progress Notes (Signed)
HPI Patient is a 32 yo who was referred from ER for evaluation of CP The patient has had CP in past.  She was seen in ER for L sided pain  She points to under her L breast.  Pain is not associated with activity.  Can last prolonged periods.   Denies SOB  May be pleuritic at times.   When not having she denies SOB   She is not that active  No recent long distance travel.   No Known Allergies  Current Outpatient Prescriptions  Medication Sig Dispense Refill  . cetirizine (ZYRTEC) 10 MG tablet Take 10 mg by mouth daily. For allergies      . ibuprofen (ADVIL,MOTRIN) 200 MG tablet Take 600 mg by mouth every 6 (six) hours as needed for cramping.      Marland Kitchen aspirin 81 MG chewable tablet Chew 1 tablet (81 mg total) by mouth daily.  30 tablet  0   No current facility-administered medications for this visit.    Past Medical History  Diagnosis Date  . Trichomonas   . No pertinent past medical history   . Kidney stones   . Kidney stone     Past Surgical History  Procedure Laterality Date  . Cholecystectomy      Family History  Problem Relation Age of Onset  . Hypertension Mother   . Heart disease Mother   . Diabetes Father   . Diabetes Maternal Grandmother     History   Social History  . Marital Status: Single    Spouse Name: N/A    Number of Children: N/A  . Years of Education: N/A   Occupational History  . Not on file.   Social History Main Topics  . Smoking status: Current Some Day Smoker  . Smokeless tobacco: Never Used  . Alcohol Use: Yes     Comment: wine occassioanlly  . Drug Use: No  . Sexual Activity: Yes    Birth Control/ Protection: None   Other Topics Concern  . Not on file   Social History Narrative  . No narrative on file    Review of Systems:  All systems reviewed.  They are negative to the above problem except as previously stated.  Vital Signs: BP 112/80  Pulse 86  Ht 5\' 6"  (1.676 m)  Wt 261 lb (118.389 kg)  BMI 42.15 kg/m2  LMP  11/05/2013  Physical Exam Patient isa morbidly obese 32 yo in NAD HEENT:  Normocephalic, atraumatic. EOMI, PERRLA.  Neck: JVP is normal.  No bruits.  Lungs: clear to auscultation. No rales no wheezes.  Heart: Regular rate and rhythm. Normal S1, S2. No S3.   No significant murmurs. PMI not displaced.  Abdomen:  Supple, nontender. Normal bowel sounds. No hepatomegaly.  Extremities:   Good distal pulses throughout. No lower extremity edema.  Musculoskeletal :moving all extremities.  Neuro:   alert and oriented x3.  CN II-XII grossly intact.  EKG  SR 87 bpm  Nonspecific ST T wave changes   Assessment and Plan:  1.  CP   Pain is atypical  I think it is musculoskeletal  I do not think cardiac in origin. Take activities as tolerated.    2.  HCM  Counselled on wt loss, exercise.  Would check fasting lipids.    3.  Tob  Counselled on cessation.

## 2013-12-10 ENCOUNTER — Telehealth: Payer: Self-pay | Admitting: Internal Medicine

## 2013-12-10 NOTE — Telephone Encounter (Signed)
Left message for pt to call.

## 2013-12-10 NOTE — Telephone Encounter (Signed)
Follow Up  Pt returned call//Results??//SR

## 2013-12-16 NOTE — Telephone Encounter (Signed)
Left message of results for pt, Letter of results sent to pt

## 2014-04-22 ENCOUNTER — Other Ambulatory Visit: Payer: Self-pay | Admitting: Obstetrics and Gynecology

## 2014-09-11 ENCOUNTER — Encounter: Payer: Self-pay | Admitting: Gastroenterology

## 2014-11-01 ENCOUNTER — Encounter: Payer: Self-pay | Admitting: Internal Medicine

## 2015-03-02 ENCOUNTER — Encounter (HOSPITAL_COMMUNITY): Payer: Self-pay

## 2015-03-02 ENCOUNTER — Inpatient Hospital Stay (HOSPITAL_COMMUNITY)
Admission: AD | Admit: 2015-03-02 | Discharge: 2015-03-02 | Disposition: A | Discharge status: Home / Self Care | Payer: BLUE CROSS/BLUE SHIELD | Source: Ambulatory Visit | Attending: Obstetrics and Gynecology | Admitting: Obstetrics and Gynecology

## 2015-03-02 ENCOUNTER — Inpatient Hospital Stay (HOSPITAL_COMMUNITY): Payer: BLUE CROSS/BLUE SHIELD

## 2015-03-02 DIAGNOSIS — Z87442 Personal history of urinary calculi: Secondary | ICD-10-CM | POA: Diagnosis not present

## 2015-03-02 DIAGNOSIS — O4691 Antepartum hemorrhage, unspecified, first trimester: Secondary | ICD-10-CM | POA: Diagnosis not present

## 2015-03-02 DIAGNOSIS — O99331 Smoking (tobacco) complicating pregnancy, first trimester: Secondary | ICD-10-CM | POA: Insufficient documentation

## 2015-03-02 DIAGNOSIS — Z3A01 Less than 8 weeks gestation of pregnancy: Secondary | ICD-10-CM | POA: Diagnosis not present

## 2015-03-02 DIAGNOSIS — Z3A13 13 weeks gestation of pregnancy: Secondary | ICD-10-CM | POA: Diagnosis not present

## 2015-03-02 DIAGNOSIS — O209 Hemorrhage in early pregnancy, unspecified: Secondary | ICD-10-CM | POA: Insufficient documentation

## 2015-03-02 LAB — CBC
HEMATOCRIT: 35.8 % — AB (ref 36.0–46.0)
Hemoglobin: 12 g/dL (ref 12.0–15.0)
MCH: 28.2 pg (ref 26.0–34.0)
MCHC: 33.5 g/dL (ref 30.0–36.0)
MCV: 84.2 fL (ref 78.0–100.0)
PLATELETS: 374 10*3/uL (ref 150–400)
RBC: 4.25 MIL/uL (ref 3.87–5.11)
RDW: 15.1 % (ref 11.5–15.5)
WBC: 7 10*3/uL (ref 4.0–10.5)

## 2015-03-02 LAB — URINE MICROSCOPIC-ADD ON

## 2015-03-02 LAB — URINALYSIS, ROUTINE W REFLEX MICROSCOPIC
Glucose, UA: NEGATIVE mg/dL
KETONES UR: NEGATIVE mg/dL
NITRITE: POSITIVE — AB
Protein, ur: 100 mg/dL — AB
SPECIFIC GRAVITY, URINE: 1.025 (ref 1.005–1.030)
UROBILINOGEN UA: 0.2 mg/dL (ref 0.0–1.0)
pH: 5.5 (ref 5.0–8.0)

## 2015-03-02 LAB — POCT PREGNANCY, URINE: PREG TEST UR: POSITIVE — AB

## 2015-03-02 LAB — WET PREP, GENITAL
CLUE CELLS WET PREP: NONE SEEN
TRICH WET PREP: NONE SEEN
WBC WET PREP: NONE SEEN
YEAST WET PREP: NONE SEEN

## 2015-03-02 LAB — HCG, QUANTITATIVE, PREGNANCY: HCG, BETA CHAIN, QUANT, S: 40834 m[IU]/mL — AB (ref <5)

## 2015-03-02 NOTE — Discharge Instructions (Signed)
Pelvic Rest °Pelvic rest is sometimes recommended for women when:  °· The placenta is partially or completely covering the opening of the cervix (placenta previa). °· There is bleeding between the uterine wall and the amniotic sac in the first trimester (subchorionic hemorrhage). °· The cervix begins to open without labor starting (incompetent cervix, cervical insufficiency). °· The labor is too early (preterm labor). °HOME CARE INSTRUCTIONS °· Do not have sexual intercourse, stimulation, or an orgasm. °· Do not use tampons, douche, or put anything in the vagina. °· Do not lift anything over 10 pounds (4.5 kg). °· Avoid strenuous activity or straining your pelvic muscles. °SEEK MEDICAL CARE IF:  °· You have any vaginal bleeding during pregnancy. Treat this as a potential emergency. °· You have cramping pain felt low in the stomach (stronger than menstrual cramps). °· You notice vaginal discharge (watery, mucus, or bloody). °· You have a low, dull backache. °· There are regular contractions or uterine tightening. °SEEK IMMEDIATE MEDICAL CARE IF: °You have vaginal bleeding and have placenta previa.  °Document Released: 04/13/2011 Document Revised: 03/10/2012 Document Reviewed: 04/13/2011 °ExitCare® Patient Information ©2015 [employer]. This information is not intended to replace advice given to you by your health care provider. Make sure you discuss any questions you have with your health care provider. ° °Vaginal Bleeding During Pregnancy, First Trimester °A small amount of bleeding (spotting) from the vagina is common in early pregnancy. Sometimes the bleeding is normal and is not a problem, and sometimes it is a sign of something serious. Be sure to tell your doctor about any bleeding from your vagina right away. °HOME CARE °· Watch your condition for any changes. °· Follow your doctor's instructions about how active you can be. °· If you are on bed rest: °¨ You may need to stay in bed and only get up to use the  bathroom. °¨ You may be allowed to do some activities. °¨ If you need help, make plans for someone to help you. °· Write down: °¨ The number of pads you use each day. °¨ How often you change pads. °¨ How soaked (saturated) your pads are. °· Do not use tampons. °· Do not douche. °· Do not have sex or orgasms until your doctor says it is okay. °· If you pass any tissue from your vagina, save the tissue so you can show it to your doctor. °· Only take medicines as told by your doctor. °· Do not take aspirin because it can make you bleed. °· Keep all follow-up visits as told by your doctor. °GET HELP IF:  °· You bleed from your vagina. °· You have cramps. °· You have labor pains. °· You have a fever that does not go away after you take medicine. °GET HELP RIGHT AWAY IF:  °· You have very bad cramps in your back or belly (abdomen). °· You pass large clots or tissue from your vagina. °· You bleed more. °· You feel light-headed or weak. °· You pass out (faint). °· You have chills. °· You are leaking fluid or have a gush of fluid from your vagina. °· You pass out while pooping (having a bowel movement). °MAKE SURE YOU: °· Understand these instructions. °· Will watch your condition. °· Will get help right away if you are not doing well or get worse. °Document Released: 05/03/2014 Document Reviewed: 08/24/2013 °ExitCare® Patient Information ©2015 [employer]. This information is not intended to replace advice given to you by your health care provider. Make   sure you discuss any questions you have with your health care provider. ° °

## 2015-03-02 NOTE — MAU Note (Signed)
Pt presents to MAU with complaints of bright red vaginal bleeding that started today. Reports +HPT last week. Mild abdominal cramping

## 2015-03-02 NOTE — MAU Provider Note (Signed)
History     CSN: [id number]  Arrival date and time: 03/02/15 1621   First Provider Initiated Contact with Patient 03/02/15 1755      Chief Complaint  Patient presents with  . Threatened Miscarriage   HPI   Ms. Bonnie Carr is a 34 y.o. female [id number] at [redacted]w[redacted]d who presents with vaginal bleeding in pregnancy. She was on [facility] pills so she is not 100% sure about her LMP. She denies pain currently, however continues to have spotting.   Her first appointment with Nestor Ramp is March 10th   OB History    Gravida Para Term Preterm AB TAB SAB Ectopic Multiple Living   Past Medical History  Diagnosis Date  . Trichomonas   . No pertinent past medical history   . Kidney stones   . Kidney stone     Past Surgical History  Procedure Laterality Date  . Cholecystectomy      Family History  Problem Relation Age of Onset  . Hypertension Mother   . Heart disease Mother   . Diabetes Father   . Diabetes Maternal Grandmother     History  Substance Use Topics  . Smoking status: Current Some Day Smoker  . Smokeless tobacco: Never Used  . Alcohol Use: Yes     Comment: wine occassioanlly    Allergies: No Known Allergies  Prescriptions prior to admission  Medication Sig Dispense Refill Last Dose  . acetaminophen (TYLENOL) 500 MG tablet Take 500 mg by mouth every 6 (six) hours as needed for mild pain.   Past Week at Unknown time  . cetirizine (ZYRTEC) 10 MG tablet Take 10 mg by mouth daily as needed for allergies. For allergies   Past Month at Unknown time  . aspirin 81 MG chewable tablet Chew 1 tablet (81 mg total) by mouth daily. (Patient not taking: Reported on 03/02/2015) 30 tablet 0 Not Taking at Unknown time   Results for orders placed or performed during the hospital encounter of 03/02/15 (from the past 72 hour(s))  GC/Chlamydia probe amp ()     Status: None   Collection Time: 03/02/15 12:00 AM  Result Value Ref Range   Chlamydia CT: Negative      Comment: Normal Reference Range - Negative   Neisseria gonorrhea NG: Negative     Comment: Normal Reference Range - Negative  Culture, OB Urine     Status: None   Collection Time: 03/02/15  4:30 PM  Result Value Ref Range   Specimen Description OB CLEAN CATCH    Special Requests NONE    Colony Count      50,000 COLONIES/ML Performed at [facility]    Culture      Multiple bacterial morphotypes present, none predominant. Suggest appropriate recollection if clinically indicated. Note: NO GROUP B STREP (S.AGALACTIAE) ISOLATED                                                              Culture based screening of vaginal/anorectal swabs at  35 to [redacted] weeks gestation is required to rule out the carriage of Group B Streptococcus. Performed at [facility]    Report Status 03/04/2015 FINAL   Urinalysis,  Routine w reflex microscopic     Status: Abnormal   Collection Time: 03/02/15  4:38 PM  Result Value Ref Range   Color, Urine RED (A) YELLOW    Comment: BIOCHEMICALS MAY BE AFFECTED BY COLOR   APPearance CLOUDY (A) CLEAR   Specific Gravity, Urine 1.025 1.005 - 1.030   pH 5.5 5.0 - 8.0   Glucose, UA NEGATIVE NEGATIVE mg/dL   Hgb urine dipstick LARGE (A) NEGATIVE   Bilirubin Urine SMALL (A) NEGATIVE   Ketones, ur NEGATIVE NEGATIVE mg/dL   Protein, ur 811 (A) NEGATIVE mg/dL   Urobilinogen, UA 0.2 0.0 - 1.0 mg/dL   Nitrite POSITIVE (A) NEGATIVE   Leukocytes, UA SMALL (A) NEGATIVE  Urine microscopic-add on     Status: Abnormal   Collection Time: 03/02/15  4:38 PM  Result Value Ref Range   Squamous Epithelial / LPF MANY (A) RARE   Urine-Other FIELD OBSCURED BY RBC'S   Pregnancy, urine POC     Status: Abnormal   Collection Time: 03/02/15  4:57 PM  Result Value Ref Range   Preg Test, Ur POSITIVE (A) NEGATIVE    Comment:        THE SENSITIVITY OF THIS METHODOLOGY IS >24 mIU/mL   CBC     Status: Abnormal   Collection Time: 03/02/15  6:10 PM  Result Value Ref Range    WBC 7.0 4.0 - 10.5 K/uL   RBC 4.25 3.87 - 5.11 MIL/uL   Hemoglobin 12.0 12.0 - 15.0 g/dL   HCT 91.4 (L) 78.2 - 95.6 %   MCV 84.2 78.0 - 100.0 fL   MCH 28.2 26.0 - 34.0 pg   MCHC 33.5 30.0 - 36.0 g/dL   RDW 21.3 08.6 - 57.8 %   Platelets 374 150 - 400 K/uL  hCG, quantitative, pregnancy     Status: Abnormal   Collection Time: 03/02/15  6:10 PM  Result Value Ref Range   hCG, Beta Chain, Quant, S 40834 (H) <5 mIU/mL    Comment:          GEST. AGE      CONC.  (mIU/mL)   <=1 WEEK        5 - 50     2 WEEKS       50 - 500     3 WEEKS       100 - 10,000     4 WEEKS     1,000 - 30,000     5 WEEKS     3,500 - 115,000   6-8 WEEKS     12,000 - 270,000    12 WEEKS     15,000 - 220,000        FEMALE AND NON-PREGNANT FEMALE:     LESS THAN 5 mIU/mL   HIV antibody     Status: None   Collection Time: 03/02/15  6:10 PM  Result Value Ref Range   HIV Screen 4th Generation wRfx Non Reactive Non Reactive    Comment: (NOTE) Performed At: [facility] [address] [place], [place] [id number] Mila Homer MD UX:[id number]   Wet prep, genital     Status: None   Collection Time: 03/02/15  6:40 PM  Result Value Ref Range   Yeast Wet Prep HPF POC NONE SEEN NONE SEEN   Trich, Wet Prep NONE SEEN NONE SEEN   Clue Cells Wet Prep HPF POC NONE SEEN NONE SEEN   WBC, Wet Prep HPF POC NONE SEEN NONE SEEN  US Ob Comp Less 14 Wks  03/02/2015   CLINICAL DATA:  Initial encounter for vaginal bleeding with positive pregnancy test.  EXAM: OBSTETRIC <14 WK [place] AND TRANSVAGINAL OB US  TECHNIQUE: Both transabdominal and transvaginal ultrasound examinations were performed for complete evaluation of the gestation as well as the maternal uterus, adnexal regions, and pelvic cul-de-sac. Transvaginal technique was performed to assess early pregnancy.  COMPARISON:  None.  FINDINGS: Intrauterine gestational sac: Visualized/normal in shape.  Yolk sac:  Visualized.  Embryo:  Visualized.  Cardiac Activity: Not  visualized.  CRL:  2  Mm   5 w   6 d                  [place] EDC: 10/27/2015  Maternal uterus/adnexae: No evidence for subchorionic hemorrhage. Small intramural fibroid is identified in the posterior mid uterus. Maternal ovaries have normal sonographic appearance bilaterally. No evidence for free fluid in the cul-de-sac.  IMPRESSION: Single intrauterine gestational sac with visualized yolk sac and embryo but no visible embryonic heart activity at this time. Findings are suspicious but not yet definitive for failed pregnancy. Recommend follow-up US in 10-14 days for definitive diagnosis. This recommendation follows SRU consensus guidelines: Diagnostic Criteria for Nonviable Pregnancy Early in the First Trimester. Malva Limes Med 2013; 604:5409-81.   Electronically Signed   By: Kennith Center M.D.   On: 03/02/2015 19:42   US Ob Transvaginal  03/02/2015   CLINICAL DATA:  Initial encounter for vaginal bleeding with positive pregnancy test.  EXAM: OBSTETRIC <14 WK [place] AND TRANSVAGINAL OB US  TECHNIQUE: Both transabdominal and transvaginal ultrasound examinations were performed for complete evaluation of the gestation as well as the maternal uterus, adnexal regions, and pelvic cul-de-sac. Transvaginal technique was performed to assess early pregnancy.  COMPARISON:  None.  FINDINGS: Intrauterine gestational sac: Visualized/normal in shape.  Yolk sac:  Visualized.  Embryo:  Visualized.  Cardiac Activity: Not visualized.  CRL:  2  Mm   5 w   6 d                  [place] EDC: 10/27/2015  Maternal uterus/adnexae: No evidence for subchorionic hemorrhage. Small intramural fibroid is identified in the posterior mid uterus. Maternal ovaries have normal sonographic appearance bilaterally. No evidence for free fluid in the cul-de-sac.  IMPRESSION: Single intrauterine gestational sac with visualized yolk sac and embryo but no visible embryonic heart activity at this time. Findings are suspicious but not yet definitive for failed pregnancy.  Recommend follow-up US in 10-14 days for definitive diagnosis. This recommendation follows SRU consensus guidelines: Diagnostic Criteria for Nonviable Pregnancy Early in the First Trimester. Malva Limes Med 2013; 191:4782-95.   Electronically Signed   By: Kennith Center M.D.   On: 03/02/2015 19:42    Review of Systems  Constitutional: Negative for fever and chills.  Gastrointestinal: Positive for nausea. Negative for vomiting, abdominal pain, diarrhea and constipation.  Genitourinary: Negative for dysuria, urgency, frequency and hematuria.   Physical Exam   Blood pressure 122/79, pulse 79, temperature 98.6 F (37 C), temperature source Oral, resp. rate 18, height 5\' 6"  (1.676 m), weight 116.121 kg (256 lb), last menstrual period 12/01/2014.  Physical Exam  Constitutional: She is oriented to person, place, and time. She appears well-developed and well-nourished. No distress.  HENT:  Head: Normocephalic.  Eyes: Pupils are equal, round, and reactive to light.  Neck: Neck supple.  Respiratory: Effort normal.  GI: Soft. She exhibits no distension. There is  no tenderness. There is no rebound and no guarding.  Musculoskeletal: Normal range of motion.  Neurological: She is alert and oriented to person, place, and time.  Skin: Skin is warm. She is not diaphoretic.  Psychiatric: Her behavior is normal.    MAU Course  Procedures  None  MDM  Unable to doppler fetal heart tones.  O positive blood type   Urine culture pending Discussed US results with Dr. Henderson CloudHorvath.   Assessment and Plan   A:  Vaginal bleeding in pregnancy  IUP with embryo [id number]  P:  Discharge home in stable condition Pelvic rest Bleeding precautions Follow up with Dr. Henderson CloudHorvath as scheduled Return to MAU if symptoms worsen  Iona HansenJennifer Irene Rasch, NP 03/04/2015 10:51 AM

## 2015-03-03 LAB — GC/CHLAMYDIA PROBE AMP (~~LOC~~) NOT AT ARMC
Chlamydia: NEGATIVE
Neisseria Gonorrhea: NEGATIVE

## 2015-03-03 LAB — HIV ANTIBODY (ROUTINE TESTING W REFLEX): HIV SCREEN 4TH GENERATION: NONREACTIVE

## 2015-03-04 LAB — CULTURE, OB URINE: Colony Count: 50000

## 2015-04-26 ENCOUNTER — Other Ambulatory Visit: Payer: Self-pay | Admitting: Obstetrics & Gynecology

## 2015-04-27 LAB — CYTOLOGY - PAP

## 2015-08-08 ENCOUNTER — Emergency Department (HOSPITAL_BASED_OUTPATIENT_CLINIC_OR_DEPARTMENT_OTHER)
Admission: EM | Admit: 2015-08-08 | Discharge: 2015-08-08 | Disposition: A | Discharge status: Home / Self Care | Payer: BLUE CROSS/BLUE SHIELD | Attending: Emergency Medicine | Admitting: Emergency Medicine

## 2015-08-08 ENCOUNTER — Encounter (HOSPITAL_BASED_OUTPATIENT_CLINIC_OR_DEPARTMENT_OTHER): Payer: Self-pay

## 2015-08-08 DIAGNOSIS — R0789 Other chest pain: Secondary | ICD-10-CM | POA: Insufficient documentation

## 2015-08-08 DIAGNOSIS — R079 Chest pain, unspecified: Secondary | ICD-10-CM | POA: Diagnosis present

## 2015-08-08 DIAGNOSIS — Z8619 Personal history of other infectious and parasitic diseases: Secondary | ICD-10-CM | POA: Insufficient documentation

## 2015-08-08 DIAGNOSIS — Z87442 Personal history of urinary calculi: Secondary | ICD-10-CM | POA: Diagnosis not present

## 2015-08-08 DIAGNOSIS — Z720 Tobacco use: Secondary | ICD-10-CM | POA: Insufficient documentation

## 2015-08-08 LAB — CBC WITH DIFFERENTIAL/PLATELET
BASOS PCT: 0 % (ref 0–1)
Basophils Absolute: 0 10*3/uL (ref 0.0–0.1)
EOS ABS: 0.1 10*3/uL (ref 0.0–0.7)
Eosinophils Relative: 1 % (ref 0–5)
HEMATOCRIT: 34.5 % — AB (ref 36.0–46.0)
Hemoglobin: 11.3 g/dL — ABNORMAL LOW (ref 12.0–15.0)
Lymphocytes Relative: 34 % (ref 12–46)
Lymphs Abs: 2.4 10*3/uL (ref 0.7–4.0)
MCH: 28.5 pg (ref 26.0–34.0)
MCHC: 32.8 g/dL (ref 30.0–36.0)
MCV: 87.1 fL (ref 78.0–100.0)
MONO ABS: 0.5 10*3/uL (ref 0.1–1.0)
MONOS PCT: 7 % (ref 3–12)
NEUTROS ABS: 4 10*3/uL (ref 1.7–7.7)
NEUTROS PCT: 58 % (ref 43–77)
Platelets: 407 10*3/uL — ABNORMAL HIGH (ref 150–400)
RBC: 3.96 MIL/uL (ref 3.87–5.11)
RDW: 13.8 % (ref 11.5–15.5)
WBC: 7 10*3/uL (ref 4.0–10.5)

## 2015-08-08 LAB — BASIC METABOLIC PANEL
ANION GAP: 8 (ref 5–15)
BUN: 9 mg/dL (ref 6–20)
CO2: 26 mmol/L (ref 22–32)
Calcium: 8.8 mg/dL — ABNORMAL LOW (ref 8.9–10.3)
Chloride: 107 mmol/L (ref 101–111)
Creatinine, Ser: 0.7 mg/dL (ref 0.44–1.00)
GLUCOSE: 115 mg/dL — AB (ref 65–99)
Potassium: 3.5 mmol/L (ref 3.5–5.1)
Sodium: 141 mmol/L (ref 135–145)

## 2015-08-08 LAB — TROPONIN I: Troponin I: <0.03 ng/mL (ref <0.031)

## 2015-08-08 NOTE — Discharge Instructions (Signed)
Take tylenol, motrin for pain. ° °Follow up with your doctor. ° ° Return to ER if you have severe chest pain, shortness of breath.  ° °

## 2015-08-08 NOTE — ED Provider Notes (Signed)
CSN: [id number]     Arrival date & time 08/08/15  1946 History  This chart was scribed for Bonnie Canal, MD by Lyndel Safe, ED Scribe. This patient was seen in room MH08/MH08 and the patient's care was started 8:48 PM.   Chief Complaint  Patient presents with  . Chest Pain   The history is provided by the patient. No language interpreter was used.   HPI Comments: Bonnie Carr is a 34 y.o. female who presents to the Emergency Department complaining of intermittent, moderate generalized chest pain that began 1 day ago first as a sharp pain but then subsiding to a dull pain. She states her current, dull chest pain radiates beneath her left breast. She additionally reports the sharp chest pain she experienced earlier today radiated to her left arm. The pt was evaluated by Bonnie Carr Cardiologist 2 years ago for the same pain in her chest when she had a negative cardiac workout. PFhx of MI <50 y/o in mother. Pt denies lifting heavy objects attributable to her chest pain. Additionally denies recent travels or chance of pregnancy.   Past Medical History  Diagnosis Date  . Trichomonas   . No pertinent past medical history   . Kidney stones   . Kidney stone    Past Surgical History  Procedure Laterality Date  . Cholecystectomy     Family History  Problem Relation Age of Onset  . Hypertension Mother   . Heart disease Mother   . Diabetes Father   . Diabetes Maternal Grandmother    History  Substance Use Topics  . Smoking status: Current Some Day Smoker  . Smokeless tobacco: Never Used  . Alcohol Use: Yes     Comment: wine occassioanlly   OB History    Gravida Para Term Preterm AB TAB SAB Ectopic Multiple Living   Review of Systems  Cardiovascular: Positive for chest pain.  Musculoskeletal: Positive for myalgias.  All other systems reviewed and are negative.  Allergies  Review of patient's allergies indicates no known allergies.  Home Medications   Prior to  Admission medications   Medication Sig Start Date End Date Taking? Authorizing Provider  acetaminophen (TYLENOL) 500 MG tablet Take 500 mg by mouth every 6 (six) hours as needed for mild pain.    Historical Provider, MD  cetirizine (ZYRTEC) 10 MG tablet Take 10 mg by mouth daily as needed for allergies. For allergies    Historical Provider, MD   BP 103/53 mmHg  Pulse 79  Temp(Src) 97.7 F (36.5 C) (Oral)  Resp 20  Ht  (1.676 m)  Wt 240 lb (108.863 kg)  BMI 38.76 kg/m2  SpO2 97%  LMP 08/06/2015  Breastfeeding? Unknown Physical Exam  Constitutional: She appears well-developed and well-nourished. No distress.  HENT:  Head: Normocephalic.  Eyes: Conjunctivae are normal. Right eye exhibits no discharge. Left eye exhibits no discharge. No scleral icterus.  Neck: No JVD present.  Pulmonary/Chest: Effort normal. No respiratory distress.  Neurological: She is alert. Coordination normal.  Skin: Skin is warm. No rash noted. No erythema. No pallor.  Psychiatric: She has a normal mood and affect. Her behavior is normal.  Nursing note and vitals reviewed.   ED Course  Procedures  DIAGNOSTIC STUDIES: Oxygen Saturation is 97% on RA, normal by my interpretation.    COORDINATION OF CARE: 8:51 PM Discussed treatment plan which includes to order diagnostic lab work with  pt. Discussed unremarkable EKG results with pt. Pt acknowledges and agrees to plan.   Labs Review Labs Reviewed  CBC WITH DIFFERENTIAL/PLATELET - Abnormal; Notable for the following:    Hemoglobin 11.3 (*)    HCT 34.5 (*)    Platelets 407 (*)    All other components within normal limits  BASIC METABOLIC PANEL - Abnormal; Notable for the following:    Glucose, Bld 115 (*)    Calcium 8.8 (*)    All other components within normal limits  TROPONIN I    Imaging Review No results found.   EKG Interpretation   Date/Time:  Monday August 08 2015 19:56:35 EDT Ventricular Rate:  72 PR Interval:  152 QRS Duration:  84 QT Interval:  366 QTC Calculation: 400 R Axis:   74 Text Interpretation:  Sinus rhythm with marked sinus arrhythmia Otherwise  normal ECG No significant change since last tracing Confirmed by Yonathan Perrow  MD,  Bryssa Tones ([id number]) on 08/08/2015 8:16:29 PM      MDM   Final diagnoses:  None   Bonnie Carr is a 34 y.o. female here with chest pain, L arm pain for about 24 hrs. Well appearing, has seen cardiology in 2014 and was thought to have MSK pain. EKG unremarkable. Labs including trop nl x 1. I doubt PE or dissection. Will dc home.    I personally performed the services described in this documentation, which was scribed in my presence. The recorded information has been reviewed and is accurate.    Bonnie Canal, MD 08/08/15 2154

## 2015-08-08 NOTE — ED Notes (Signed)
Pt c/o center chest pain radiating to under lt breast and down lt arm today and some yesterday; states this is the same pain that's been going on for the past few year; states went to [employer] with a clear health screening

## 2015-12-06 ENCOUNTER — Encounter (HOSPITAL_BASED_OUTPATIENT_CLINIC_OR_DEPARTMENT_OTHER): Payer: Self-pay | Admitting: *Deleted

## 2015-12-06 ENCOUNTER — Emergency Department (HOSPITAL_BASED_OUTPATIENT_CLINIC_OR_DEPARTMENT_OTHER): Payer: BLUE CROSS/BLUE SHIELD

## 2015-12-06 ENCOUNTER — Emergency Department (HOSPITAL_BASED_OUTPATIENT_CLINIC_OR_DEPARTMENT_OTHER)
Admission: EM | Admit: 2015-12-06 | Discharge: 2015-12-06 | Disposition: A | Discharge status: Home / Self Care | Payer: BLUE CROSS/BLUE SHIELD | Attending: Emergency Medicine | Admitting: Emergency Medicine

## 2015-12-06 DIAGNOSIS — Z9049 Acquired absence of other specified parts of digestive tract: Secondary | ICD-10-CM | POA: Diagnosis not present

## 2015-12-06 DIAGNOSIS — R102 Pelvic and perineal pain: Secondary | ICD-10-CM

## 2015-12-06 DIAGNOSIS — Z87442 Personal history of urinary calculi: Secondary | ICD-10-CM | POA: Diagnosis not present

## 2015-12-06 DIAGNOSIS — Z3202 Encounter for pregnancy test, result negative: Secondary | ICD-10-CM | POA: Diagnosis not present

## 2015-12-06 DIAGNOSIS — Z8619 Personal history of other infectious and parasitic diseases: Secondary | ICD-10-CM | POA: Insufficient documentation

## 2015-12-06 DIAGNOSIS — N83201 Unspecified ovarian cyst, right side: Secondary | ICD-10-CM | POA: Diagnosis not present

## 2015-12-06 DIAGNOSIS — M545 Low back pain: Secondary | ICD-10-CM | POA: Diagnosis present

## 2015-12-06 DIAGNOSIS — F17210 Nicotine dependence, cigarettes, uncomplicated: Secondary | ICD-10-CM | POA: Insufficient documentation

## 2015-12-06 DIAGNOSIS — N83209 Unspecified ovarian cyst, unspecified side: Secondary | ICD-10-CM

## 2015-12-06 LAB — URINALYSIS, ROUTINE W REFLEX MICROSCOPIC
Bilirubin Urine: NEGATIVE
Glucose, UA: NEGATIVE mg/dL
Hgb urine dipstick: NEGATIVE
Ketones, ur: NEGATIVE mg/dL
Leukocytes, UA: NEGATIVE
NITRITE: NEGATIVE
PH: 6 (ref 5.0–8.0)
Protein, ur: NEGATIVE mg/dL
SPECIFIC GRAVITY, URINE: 1.017 (ref 1.005–1.030)

## 2015-12-06 LAB — PREGNANCY, URINE: PREG TEST UR: NEGATIVE

## 2015-12-06 MED ORDER — HYDROCODONE-ACETAMINOPHEN 5-325 MG PO TABS: 1.0000 | ORAL_TABLET | Freq: Four times a day (QID) | ORAL | Status: DC | PRN

## 2015-12-06 NOTE — ED Provider Notes (Signed)
CSN: [id number]     Arrival date & time 12/06/15  0825 History   First MD Initiated Contact with Patient 12/06/15 0901     No chief complaint on file.    (Consider location/radiation/quality/duration/timing/severity/associated sxs/prior Treatment) HPI Comments: Patient is a 34 year old female with history of kidney stones. She presents for evaluation of pain in her low back for the past several days. She reports frequent urination but no burning. She denies any fevers or chills. She denies any flank pain. The pain is now moving to her lower abdomen. She denies any constipation or diarrhea. He denies any blood in her urine. She reports not having periods that she is on some form of birth control which prevents them. She denies any discharge. She is sexually active, but denies the possibility of pregnancy.  The history is provided by the patient.    Past Medical History  Diagnosis Date  . Trichomonas   . No pertinent past medical history   . Kidney stones   . Kidney stone    Past Surgical History  Procedure Laterality Date  . Cholecystectomy     Family History  Problem Relation Age of Onset  . Hypertension Mother   . Heart disease Mother   . Diabetes Father   . Diabetes Maternal Grandmother    Social History  Substance Use Topics  . Smoking status: Current Some Day Smoker -- 0.50 packs/day    Types: Cigarettes  . Smokeless tobacco: Never Used  . Alcohol Use: Yes     Comment: wine occassioanlly   OB History    Gravida Para Term Preterm AB TAB SAB Ectopic Multiple Living   3    2 2          Review of Systems  All other systems reviewed and are negative.     Allergies  Review of patient's allergies indicates no known allergies.  Home Medications   Prior to Admission medications   Medication Sig Start Date End Date Taking? Authorizing Provider  acetaminophen (TYLENOL) 500 MG tablet Take 500 mg by mouth every 6 (six) hours as needed for mild pain.   Yes Historical  Provider, MD  cetirizine (ZYRTEC) 10 MG tablet Take 10 mg by mouth daily as needed for allergies. For allergies   Yes Historical Provider, MD   BP 99/66 mmHg  Pulse 88  Temp(Src) 98.3 F (36.8 C) (Oral)  Resp 18  Ht 5\' 6"  (1.676 m)  Wt 230 lb (104.327 kg)  BMI 37.14 kg/m2  SpO2 100%  LMP 12/01/2014 Physical Exam  Constitutional: She is oriented to person, place, and time. She appears well-developed and well-nourished. No distress.  HENT:  Head: Normocephalic and atraumatic.  Neck: Normal range of motion. Neck supple.  Cardiovascular: Normal rate and regular rhythm.  Exam reveals no gallop and no friction rub.   No murmur heard. Pulmonary/Chest: Effort normal and breath sounds normal. No respiratory distress. She has no wheezes.  Abdominal: Soft. Bowel sounds are normal. She exhibits no distension. There is tenderness. There is no rebound and no guarding.  There is mild tenderness to palpation in the suprapubic region.  Musculoskeletal: Normal range of motion.  Neurological: She is alert and oriented to person, place, and time.  Skin: Skin is warm and dry. She is not diaphoretic.  Nursing note and vitals reviewed.   ED Course  Procedures (including critical care time) Labs Review Labs Reviewed  URINALYSIS, ROUTINE W REFLEX MICROSCOPIC (NOT AT [facility])  PREGNANCY, URINE    Imaging  Review No results found. I have personally reviewed and evaluated these images and lab results as part of my medical decision-making.   EKG Interpretation None      MDM   Final diagnoses:  None    Ultrasound reveals free fluid in the pelvis that is consistent with either a ruptured ovarian cyst or hemorrhagic ovarian cyst. She is otherwise comfortable and nontoxic appearing. She is hemodynamically stable. She will be treated with pain medication and follow-up with her GYN.    Geoffery Lyons, MD 12/06/15 1037

## 2015-12-06 NOTE — ED Notes (Signed)
C/o low back pain and low mid abd pain, frequent urination, vomiting, no fever, injury.

## 2015-12-06 NOTE — Discharge Instructions (Signed)
Hydrocodone as prescribed as needed for pain.  Follow-up with your GYN in 1 week for a recheck. The radiologist is recommending a repeat ultrasound in 6 weeks.  Return to the ER if symptoms significantly worsen or change.   Pelvic Pain, Female Female pelvic pain can be caused by many different things and start from a variety of places. Pelvic pain refers to pain that is located in the lower half of the abdomen and between your hips. The pain may occur over a short period of time (acute) or may be reoccurring (chronic). The cause of pelvic pain may be related to disorders affecting the female reproductive organs (gynecologic), but it may also be related to the bladder, kidney stones, an intestinal complication, or muscle or skeletal problems. Getting help right away for pelvic pain is important, especially if there has been severe, sharp, or a sudden onset of unusual pain. It is also important to get help right away because some types of pelvic pain can be life threatening.  CAUSES  Below are only some of the causes of pelvic pain. The causes of pelvic pain can be in one of several categories.   Gynecologic.  Pelvic inflammatory disease.  Sexually transmitted infection.  Ovarian cyst or a twisted ovarian ligament (ovarian torsion).  Uterine lining that grows outside the uterus (endometriosis).  Fibroids, cysts, or tumors.  Ovulation.  Pregnancy.  Pregnancy that occurs outside the uterus (ectopic pregnancy).  Miscarriage.  Labor.  Abruption of the placenta or ruptured uterus.  Infection.  Uterine infection (endometritis).  Bladder infection.  Diverticulitis.  Miscarriage related to a uterine infection (septic abortion).  Bladder.  Inflammation of the bladder (cystitis).  Kidney stone(s).  Gastrointestinal.  Constipation.  Diverticulitis.  Neurologic.  Trauma.  Feeling pelvic pain because of mental or emotional causes (psychosomatic).  Cancers of the bowel  or pelvis. EVALUATION  Your caregiver will want to take a careful history of your concerns. This includes recent changes in your health, a careful gynecologic history of your periods (menses), and a sexual history. Obtaining your family history and medical history is also important. Your caregiver may suggest a pelvic exam. A pelvic exam will help identify the location and severity of the pain. It also helps in the evaluation of which organ system may be involved. In order to identify the cause of the pelvic pain and be properly treated, your caregiver may order tests. These tests may include:   A pregnancy test.  Pelvic ultrasonography.  An X-ray exam of the abdomen.  A urinalysis or evaluation of vaginal discharge.  Blood tests. HOME CARE INSTRUCTIONS   Only take over-the-counter or prescription medicines for pain, discomfort, or fever as directed by your caregiver.   Rest as directed by your caregiver.   Eat a balanced diet.   Drink enough fluids to make your urine clear or pale yellow, or as directed.   Avoid sexual intercourse if it causes pain.   Apply warm or cold compresses to the lower abdomen depending on which one helps the pain.   Avoid stressful situations.   Keep a journal of your pelvic pain. Write down when it started, where the pain is located, and if there are things that seem to be associated with the pain, such as food or your menstrual cycle.  Follow up with your caregiver as directed.  SEEK MEDICAL CARE IF:  Your medicine does not help your pain.  You have abnormal vaginal discharge. SEEK IMMEDIATE MEDICAL CARE IF:   You  have heavy bleeding from the vagina.   Your pelvic pain increases.   You feel light-headed or faint.   You have chills.   You have pain with urination or blood in your urine.   You have uncontrolled diarrhea or vomiting.   You have a fever or persistent symptoms for more than 3 days.  You have a fever and your  symptoms suddenly get worse.   You are being physically or sexually abused.   This information is not intended to replace advice given to you by your health care provider. Make sure you discuss any questions you have with your health care provider.   Document Released: 11/13/2004 Document Revised: 09/07/2015 Document Reviewed: 04/07/2012 Elsevier Interactive Patient Education [employer]2016 [employer] Inc.

## 2016-04-09 ENCOUNTER — Encounter (HOSPITAL_COMMUNITY): Payer: Self-pay | Admitting: Emergency Medicine

## 2016-04-09 ENCOUNTER — Emergency Department (HOSPITAL_COMMUNITY): Payer: BLUE CROSS/BLUE SHIELD

## 2016-04-09 ENCOUNTER — Emergency Department (HOSPITAL_COMMUNITY)
Admission: EM | Admit: 2016-04-09 | Discharge: 2016-04-09 | Disposition: A | Discharge status: Home / Self Care | Payer: BLUE CROSS/BLUE SHIELD | Attending: Emergency Medicine | Admitting: Emergency Medicine

## 2016-04-09 DIAGNOSIS — R112 Nausea with vomiting, unspecified: Secondary | ICD-10-CM | POA: Diagnosis present

## 2016-04-09 DIAGNOSIS — Z87442 Personal history of urinary calculi: Secondary | ICD-10-CM | POA: Insufficient documentation

## 2016-04-09 DIAGNOSIS — M545 Low back pain: Secondary | ICD-10-CM | POA: Diagnosis not present

## 2016-04-09 DIAGNOSIS — Z3202 Encounter for pregnancy test, result negative: Secondary | ICD-10-CM | POA: Insufficient documentation

## 2016-04-09 DIAGNOSIS — F17210 Nicotine dependence, cigarettes, uncomplicated: Secondary | ICD-10-CM | POA: Diagnosis not present

## 2016-04-09 DIAGNOSIS — B349 Viral infection, unspecified: Secondary | ICD-10-CM | POA: Diagnosis not present

## 2016-04-09 LAB — CBC
HCT: 38.5 % (ref 36.0–46.0)
HEMOGLOBIN: 12.7 g/dL (ref 12.0–15.0)
MCH: 27.8 pg (ref 26.0–34.0)
MCHC: 33 g/dL (ref 30.0–36.0)
MCV: 84.2 fL (ref 78.0–100.0)
Platelets: 401 10*3/uL — ABNORMAL HIGH (ref 150–400)
RBC: 4.57 MIL/uL (ref 3.87–5.11)
RDW: 14.4 % (ref 11.5–15.5)
WBC: 8.8 10*3/uL (ref 4.0–10.5)

## 2016-04-09 LAB — COMPREHENSIVE METABOLIC PANEL
ALBUMIN: 4.2 g/dL (ref 3.5–5.0)
ALK PHOS: 70 U/L (ref 38–126)
ALT: 17 U/L (ref 14–54)
ANION GAP: 10 (ref 5–15)
AST: 16 U/L (ref 15–41)
BILIRUBIN TOTAL: 0.2 mg/dL — AB (ref 0.3–1.2)
BUN: 9 mg/dL (ref 6–20)
CALCIUM: 9 mg/dL (ref 8.9–10.3)
CO2: 22 mmol/L (ref 22–32)
Chloride: 101 mmol/L (ref 101–111)
Creatinine, Ser: 0.84 mg/dL (ref 0.44–1.00)
GLUCOSE: 129 mg/dL — AB (ref 65–99)
POTASSIUM: 3.7 mmol/L (ref 3.5–5.1)
SODIUM: 133 mmol/L — AB (ref 135–145)
TOTAL PROTEIN: 8.6 g/dL — AB (ref 6.5–8.1)

## 2016-04-09 LAB — RAPID STREP SCREEN (MED CTR MEBANE ONLY): STREPTOCOCCUS, GROUP A SCREEN (DIRECT): NEGATIVE

## 2016-04-09 LAB — URINALYSIS, ROUTINE W REFLEX MICROSCOPIC
BILIRUBIN URINE: NEGATIVE
Glucose, UA: NEGATIVE mg/dL
Hgb urine dipstick: NEGATIVE
Ketones, ur: 15 mg/dL — AB
NITRITE: NEGATIVE
Protein, ur: 30 mg/dL — AB
SPECIFIC GRAVITY, URINE: 1.038 — AB (ref 1.005–1.030)
pH: 5.5 (ref 5.0–8.0)

## 2016-04-09 LAB — LIPASE, BLOOD: LIPASE: 23 U/L (ref 11–51)

## 2016-04-09 LAB — URINE MICROSCOPIC-ADD ON

## 2016-04-09 LAB — PREGNANCY, URINE: PREG TEST UR: NEGATIVE

## 2016-04-09 MED ORDER — ONDANSETRON HCL 4 MG PO TABS: 4.0000 mg | ORAL_TABLET | Freq: Three times a day (TID) | ORAL | Status: DC | PRN

## 2016-04-09 MED ORDER — FAMOTIDINE IN NACL 20-0.9 MG/50ML-% IV SOLN
20.0000 mg | Freq: Once | INTRAVENOUS | Status: AC
  Administered 2016-04-09: 20 mg via INTRAVENOUS
  Filled 2016-04-09: qty 50

## 2016-04-09 MED ORDER — ONDANSETRON HCL 4 MG/2ML IJ SOLN
4.0000 mg | INTRAMUSCULAR | Status: DC | PRN
  Administered 2016-04-09: 4 mg via INTRAVENOUS
  Filled 2016-04-09: qty 2

## 2016-04-09 MED ORDER — ACETAMINOPHEN 500 MG PO TABS
1000.0000 mg | ORAL_TABLET | Freq: Once | ORAL | Status: AC
  Administered 2016-04-09: 1000 mg via ORAL
  Filled 2016-04-09: qty 2

## 2016-04-09 MED ORDER — SODIUM CHLORIDE 0.9 % IV SOLN
INTRAVENOUS | Status: DC
  Administered 2016-04-09: 09:00:00 via INTRAVENOUS

## 2016-04-09 MED ORDER — SODIUM CHLORIDE 0.9 % IV BOLUS (SEPSIS)
1000.0000 mL | Freq: Once | INTRAVENOUS | Status: AC
  Administered 2016-04-09: 1000 mL via INTRAVENOUS

## 2016-04-09 MED ORDER — IBUPROFEN 200 MG PO TABS
400.0000 mg | ORAL_TABLET | Freq: Once | ORAL | Status: AC
  Administered 2016-04-09: 400 mg via ORAL
  Filled 2016-04-09: qty 2

## 2016-04-09 NOTE — Discharge Instructions (Signed)
Take over the counter decongestant (such as sudafed), as directed on packaging, for the next week.  Use over the counter normal saline nasal spray, as instructed in the Emergency Department, several times per day for the next 2 weeks. Take the prescription as directed.  Increase your fluid intake (ie:  Gatoraide) for the next few days.  Eat a bland diet and advance to your regular diet slowly as you can tolerate it.   Call your regular medical doctor today to schedule a follow up appointment in the next 2 days.  Return to the Emergency Department immediately sooner if worsening.

## 2016-04-09 NOTE — ED Provider Notes (Signed)
CSN: [id number]     Arrival date & time 04/09/16  1191 History   First MD Initiated Contact with Patient 04/09/16 [phone]     Chief Complaint  Patient presents with  . Emesis  . Back Pain     HPI  Pt was seen at 0850.  Per pt, c/o gradual onset and persistence of multiple symptoms for the past 2 days. Symptoms include: multiple intermittent episodes of N/V, LBP, sore throat, runny/stuffy nose, sinus congestion, and cough, generalized body aches/fatigue. Denies abd pain, no diarrhea, no fevers, no rash, no CP/SOB, no black or blood in stools or emesis.   Past Medical History  Diagnosis Date  . Trichomonas   . Kidney stones   . Kidney stone    Past Surgical History  Procedure Laterality Date  . Cholecystectomy     Family History  Problem Relation Age of Onset  . Hypertension Mother   . Heart disease Mother   . Diabetes Father   . Diabetes Maternal Grandmother    Social History  Substance Use Topics  . Smoking status: Current Some Day Smoker -- 0.50 packs/day    Types: Cigarettes  . Smokeless tobacco: Never Used  . Alcohol Use: Yes     Comment: wine occassioanlly   OB History    Gravida Para Term Preterm AB TAB SAB Ectopic Multiple Living   Review of Systems ROS: Statement: All systems negative except as marked or noted in the HPI; Constitutional: Negative for fever and chills. +generalized body aches/fatigue.; ; Eyes: Negative for eye pain, redness and discharge. ; ; ENMT: Negative for ear pain, hoarseness, +nasal congestion, sinus pressure and sore throat. ; ; Cardiovascular: Negative for chest pain, palpitations, diaphoresis, dyspnea and peripheral edema. ; ; Respiratory: +cough. Negative for wheezing and stridor. ; ; Gastrointestinal: +N/V. Negative for diarrhea, abdominal pain, blood in stool, hematemesis, jaundice and rectal bleeding. . ; ; Genitourinary: Negative for dysuria, flank pain and hematuria. ; ; Musculoskeletal: +LBP. Negative for neck pain.  Negative for swelling and trauma.; ; Skin: Negative for pruritus, rash, abrasions, blisters, bruising and skin lesion.; ; Neuro: Negative for headache, lightheadedness and neck stiffness. Negative for weakness, altered level of consciousness , altered mental status, extremity weakness, paresthesias, involuntary movement, seizure and syncope.      Allergies  Review of patient's allergies indicates no known allergies.  Home Medications   Prior to Admission medications   Medication Sig Start Date End Date Taking? Authorizing Provider  acetaminophen (TYLENOL) 500 MG tablet Take 500 mg by mouth every 6 (six) hours as needed for mild pain.   Yes Historical Provider, MD  cetirizine (ZYRTEC) 10 MG tablet Take 10 mg by mouth daily as needed for allergies. For allergies   Yes Historical Provider, MD  Chlorphen-Pseudoephed-APAP (TYLENOL ALLERGY SINUS PO) Take 1 tablet by mouth every 4 (four) hours as needed (allergies).   Yes Historical Provider, MD  Multiple Vitamin (MULTIVITAMIN WITH MINERALS) TABS tablet Take 1 tablet by mouth daily.   Yes Historical Provider, MD  HYDROcodone-acetaminophen (NORCO) 5-325 MG tablet Take 1-2 tablets by mouth every 6 (six) hours as needed. 12/06/15   Geoffery Lyons, MD   BP 119/69 mmHg  Pulse 101  Temp(Src) 100 F (37.8 C) (Oral)  Resp 20  SpO2 97% Physical Exam  0855: Physical examination:  Nursing notes reviewed; Vital signs and O2 SAT reviewed;  Constitutional: Well developed, Well nourished, In no acute  distress; Head:  Normocephalic, atraumatic; Eyes: EOMI, PERRL, No scleral icterus; ENMT: TM's clear bilat. +edemetous nasal turbinates bilat with clear rhinorrhea. Mouth and pharynx without lesions. No tonsillar exudates. No intra-oral edema. No submandibular or sublingual edema. No hoarse voice, no drooling, no stridor. No pain with manipulation of larynx. No trismus. Mouth and pharynx normal, Mucous membranes dry; Neck: Supple, Full range of motion, No  lymphadenopathy; Cardiovascular: Tachycardic rate and rhythm, No gallop; Respiratory: Breath sounds clear & equal bilaterally, No wheezes.  Speaking full sentences with ease, Normal respiratory effort/excursion; Chest: Nontender, Movement normal; Abdomen: Soft, Nontender, Nondistended, Normal bowel sounds; Genitourinary: No CVA tenderness; Spine:  No midline CS, TS, LS tenderness. +TTP left lumbar paraspinal muscles. No rash.;; Extremities: Pulses normal, No tenderness, No edema, No calf edema or asymmetry.; Neuro: AA&Ox3, Major CN grossly intact.  Speech clear. No gross focal motor or sensory deficits in extremities.; Skin: Color normal, Warm, Dry.   ED Course  Procedures (including critical care time) Labs Review  Imaging Review  I have personally reviewed and evaluated these images and lab results as part of my medical decision-making.   EKG Interpretation None      MDM  MDM Reviewed: previous chart, nursing note and vitals Reviewed previous: labs Interpretation: labs, x-ray and CT scan      Results for orders placed or performed during the hospital encounter of 04/09/16  Rapid strep screen  Result Value Ref Range   Streptococcus, Group A Screen (Direct) NEGATIVE NEGATIVE  Lipase, blood  Result Value Ref Range   Lipase 23 11 - 51 U/L  Comprehensive metabolic panel  Result Value Ref Range   Sodium 133 (L) 135 - 145 mmol/L   Potassium 3.7 3.5 - 5.1 mmol/L   Chloride 101 101 - 111 mmol/L   CO2 22 22 - 32 mmol/L   Glucose, Bld 129 (H) 65 - 99 mg/dL   BUN 9 6 - 20 mg/dL   Creatinine, Ser 3.080.84 0.44 - 1.00 mg/dL   Calcium 9.0 8.9 - 65.710.3 mg/dL   Total Protein 8.6 (H) 6.5 - 8.1 g/dL   Albumin 4.2 3.5 - 5.0 g/dL   AST 16 15 - 41 U/L   ALT 17 14 - 54 U/L   Alkaline Phosphatase 70 38 - 126 U/L   Total Bilirubin 0.2 (L) 0.3 - 1.2 mg/dL   GFR calc non Af Amer >60 >60 mL/min   GFR calc Af Amer >60 >60 mL/min   Anion gap 10 5 - 15  CBC  Result Value Ref Range   WBC 8.8 4.0 -  10.5 K/uL   RBC 4.57 3.87 - 5.11 MIL/uL   Hemoglobin 12.7 12.0 - 15.0 g/dL   HCT 84.638.5 96.236.0 - 95.246.0 %   MCV 84.2 78.0 - 100.0 fL   MCH 27.8 26.0 - 34.0 pg   MCHC 33.0 30.0 - 36.0 g/dL   RDW 84.114.4 32.411.5 - 40.115.5 %   Platelets 401 (H) 150 - 400 K/uL  Urinalysis, Routine w reflex microscopic ([facility])  Result Value Ref Range   Color, Urine YELLOW YELLOW   APPearance TURBID (A) CLEAR   Specific Gravity, Urine 1.038 (H) 1.005 - 1.030   pH 5.5 5.0 - 8.0   Glucose, UA NEGATIVE NEGATIVE mg/dL   Hgb urine dipstick NEGATIVE NEGATIVE   Bilirubin Urine NEGATIVE NEGATIVE   Ketones, ur 15 (A) NEGATIVE mg/dL   Protein, ur 30 (A) NEGATIVE mg/dL   Nitrite NEGATIVE NEGATIVE   Leukocytes, UA TRACE (A)  NEGATIVE  Pregnancy, urine  Result Value Ref Range   Preg Test, Ur NEGATIVE NEGATIVE  Urine microscopic-add on  Result Value Ref Range   Squamous Epithelial / LPF 6-30 (A) NONE SEEN   WBC, UA 0-5 0 - 5 WBC/hpf   RBC / HPF 0-5 0 - 5 RBC/hpf   Bacteria, UA MANY (A) NONE SEEN   Urine-Other MUCOUS PRESENT    Dg Chest 2 View 04/09/2016  CLINICAL DATA:  Cough, body aches, nausea, vomiting, sore throat, fever, and low back pain. EXAM: CHEST  2 VIEW COMPARISON:  11/12/2013 FINDINGS: The cardiomediastinal silhouette is within normal limits. The lungs are well inflated and clear. There is no evidence of pleural effusion or pneumothorax. No acute osseous abnormality is identified. Right upper quadrant abdominal surgical clips are noted. IMPRESSION: No active cardiopulmonary disease. Electronically Signed   By: Sebastian Ache M.D.   On: 04/09/2016 09:51   Ct Renal Stone Study 04/09/2016  CLINICAL DATA:  Low back pain for 2 days EXAM: CT ABDOMEN AND PELVIS WITHOUT CONTRAST TECHNIQUE: Multidetector CT imaging of the abdomen and pelvis was performed following the standard protocol without IV contrast. COMPARISON:  02/01/2013 FINDINGS: Lower chest: Lung bases are clear. No effusions. Heart is normal size. Hepatobiliary:  Prior cholecystectomy.  No focal hepatic abnormality Pancreas: Unremarkable unenhanced appearance Spleen: Unremarkable unenhanced appearance Adrenals/Urinary Tract: Punctate nonobstructing stones in the lower pole of the right kidney. No stones on the left. No hydronephrosis. Urinary bladder and adrenal glands are unremarkable. Stomach/Bowel: Appendix is normal. Stomach, large and small bowel grossly unremarkable. Vascular/Lymphatic: No evidence of aneurysm or adenopathy. Reproductive: 4.4 cm left ovarian cyst, slightly larger than on prior study. Uterus and right adnexa unremarkable. Other: No free fluid or free air. Musculoskeletal: No acute bony abnormality. IMPRESSION: Right lower pole punctate nephrolithiasis. No ureteral stones or hydronephrosis 4.3 cm left ovarian cyst. Electronically Signed   By: Charlett Nose M.D.   On: 04/09/2016 10:54    1240:  Pt has tol PO well while in the ED without N/V.  No stooling while in the ED.  Abd remains benign, VSS. Feels better and wants to go home now. Udip contaminated. Workup otherwise reassuring. Tx symptomatically at this time. Dx and testing d/w pt and family.  Questions answered.  Verb understanding, agreeable to d/c home with outpt f/u.    Samuel Jester, DO 04/13/16 [id number]

## 2016-04-09 NOTE — ED Notes (Signed)
Patient transported to CT 

## 2016-04-09 NOTE — ED Notes (Signed)
C/o emesis and lower back pain x 2 days. States the lower back pain is spreading down her legs and causing small bruising to her thighs. Denies abdominal pain, but does feel like she needs urinate more frequently.

## 2016-04-11 LAB — CULTURE, GROUP A STREP (THRC)

## 2016-07-25 ENCOUNTER — Other Ambulatory Visit: Payer: Self-pay | Admitting: Obstetrics & Gynecology

## 2016-07-26 LAB — CYTOLOGY - PAP

## 2016-07-31 ENCOUNTER — Encounter (HOSPITAL_COMMUNITY): Payer: Self-pay

## 2016-08-01 ENCOUNTER — Other Ambulatory Visit: Payer: Self-pay | Admitting: Obstetrics & Gynecology

## 2016-08-24 ENCOUNTER — Ambulatory Visit (HOSPITAL_COMMUNITY): Payer: BLUE CROSS/BLUE SHIELD | Admitting: Anesthesiology

## 2016-08-24 ENCOUNTER — Ambulatory Visit (HOSPITAL_COMMUNITY)
Admission: RE | Admit: 2016-08-24 | Discharge: 2016-08-24 | Disposition: A | Discharge status: Home / Self Care | Payer: BLUE CROSS/BLUE SHIELD | Source: Ambulatory Visit | Attending: Obstetrics & Gynecology | Admitting: Obstetrics & Gynecology

## 2016-08-24 ENCOUNTER — Encounter (HOSPITAL_COMMUNITY): Payer: Self-pay

## 2016-08-24 ENCOUNTER — Encounter (HOSPITAL_COMMUNITY)
Admission: RE | Disposition: A | Discharge status: Home / Self Care | Payer: Self-pay | Source: Ambulatory Visit | Attending: Obstetrics & Gynecology

## 2016-08-24 DIAGNOSIS — N938 Other specified abnormal uterine and vaginal bleeding: Secondary | ICD-10-CM | POA: Insufficient documentation

## 2016-08-24 DIAGNOSIS — F17210 Nicotine dependence, cigarettes, uncomplicated: Secondary | ICD-10-CM | POA: Insufficient documentation

## 2016-08-24 DIAGNOSIS — N840 Polyp of corpus uteri: Secondary | ICD-10-CM | POA: Diagnosis not present

## 2016-08-24 DIAGNOSIS — K219 Gastro-esophageal reflux disease without esophagitis: Secondary | ICD-10-CM | POA: Insufficient documentation

## 2016-08-24 DIAGNOSIS — N946 Dysmenorrhea, unspecified: Secondary | ICD-10-CM | POA: Diagnosis present

## 2016-08-24 HISTORY — DX: Prediabetes: R73.03

## 2016-08-24 HISTORY — PX: HYSTEROSCOPY W/D&C: SHX1775

## 2016-08-24 HISTORY — DX: Cardiac arrhythmia, unspecified: I49.9

## 2016-08-24 HISTORY — DX: Gastro-esophageal reflux disease without esophagitis: K21.9

## 2016-08-24 LAB — PREGNANCY, URINE: PREG TEST UR: NEGATIVE

## 2016-08-24 SURGERY — DILATATION AND CURETTAGE /HYSTEROSCOPY
Anesthesia: General

## 2016-08-24 MED ORDER — FENTANYL CITRATE (PF) 100 MCG/2ML IJ SOLN
INTRAMUSCULAR | Status: AC
  Filled 2016-08-24: qty 2

## 2016-08-24 MED ORDER — SCOPOLAMINE 1 MG/3DAYS TD PT72
MEDICATED_PATCH | TRANSDERMAL | Status: AC
  Administered 2016-08-24: 1.5 mg via TRANSDERMAL
  Filled 2016-08-24: qty 1

## 2016-08-24 MED ORDER — PROMETHAZINE HCL 25 MG/ML IJ SOLN
6.2500 mg | INTRAMUSCULAR | Status: DC | PRN
Start: 2016-08-24 — End: 2016-08-24

## 2016-08-24 MED ORDER — DEXAMETHASONE SODIUM PHOSPHATE 4 MG/ML IJ SOLN
INTRAMUSCULAR | Status: AC
  Filled 2016-08-24: qty 1

## 2016-08-24 MED ORDER — LACTATED RINGERS IV SOLN
INTRAVENOUS | Status: DC
  Administered 2016-08-24 (×2): via INTRAVENOUS

## 2016-08-24 MED ORDER — LIDOCAINE HCL 1 % IJ SOLN
INTRAMUSCULAR | Status: AC
  Filled 2016-08-24: qty 20

## 2016-08-24 MED ORDER — MIDAZOLAM HCL 2 MG/2ML IJ SOLN
INTRAMUSCULAR | Status: AC
  Filled 2016-08-24: qty 2

## 2016-08-24 MED ORDER — SCOPOLAMINE 1 MG/3DAYS TD PT72
1.0000 | MEDICATED_PATCH | Freq: Once | TRANSDERMAL | Status: DC
  Administered 2016-08-24: 1.5 mg via TRANSDERMAL

## 2016-08-24 MED ORDER — FENTANYL CITRATE (PF) 100 MCG/2ML IJ SOLN
INTRAMUSCULAR | Status: DC | PRN
  Administered 2016-08-24 (×2): 50 ug via INTRAVENOUS

## 2016-08-24 MED ORDER — LIDOCAINE HCL 1 % IJ SOLN
INTRAMUSCULAR | Status: DC | PRN
  Administered 2016-08-24: 10 mL

## 2016-08-24 MED ORDER — LIDOCAINE HCL (CARDIAC) 20 MG/ML IV SOLN
INTRAVENOUS | Status: DC | PRN
  Administered 2016-08-24: 80 mg via INTRAVENOUS

## 2016-08-24 MED ORDER — HYDROMORPHONE HCL 1 MG/ML IJ SOLN
INTRAMUSCULAR | Status: AC
  Filled 2016-08-24: qty 1

## 2016-08-24 MED ORDER — MIDAZOLAM HCL 2 MG/2ML IJ SOLN
INTRAMUSCULAR | Status: DC | PRN
  Administered 2016-08-24 (×2): 1 mg via INTRAVENOUS

## 2016-08-24 MED ORDER — SODIUM CHLORIDE 0.9 % IR SOLN
Status: DC | PRN
  Administered 2016-08-24: 3000 mL

## 2016-08-24 MED ORDER — IBUPROFEN 200 MG PO TABS: 600.0000 mg | ORAL_TABLET | Freq: Four times a day (QID) | ORAL | 0 refills | Status: DC | PRN

## 2016-08-24 MED ORDER — KETOROLAC TROMETHAMINE 30 MG/ML IJ SOLN
INTRAMUSCULAR | Status: AC
  Filled 2016-08-24: qty 1

## 2016-08-24 MED ORDER — DEXAMETHASONE SODIUM PHOSPHATE 10 MG/ML IJ SOLN
INTRAMUSCULAR | Status: DC | PRN
  Administered 2016-08-24: 4 mg via INTRAVENOUS

## 2016-08-24 MED ORDER — PROPOFOL 10 MG/ML IV BOLUS
INTRAVENOUS | Status: AC
  Filled 2016-08-24: qty 20

## 2016-08-24 MED ORDER — ONDANSETRON HCL 4 MG/2ML IJ SOLN
INTRAMUSCULAR | Status: DC | PRN
  Administered 2016-08-24: 4 mg via INTRAVENOUS

## 2016-08-24 MED ORDER — HYDROMORPHONE HCL 1 MG/ML IJ SOLN
INTRAMUSCULAR | Status: DC | PRN
  Administered 2016-08-24 (×2): 0.5 mg via INTRAVENOUS

## 2016-08-24 MED ORDER — PROPOFOL 10 MG/ML IV BOLUS
INTRAVENOUS | Status: DC | PRN
  Administered 2016-08-24: 200 mg via INTRAVENOUS

## 2016-08-24 MED ORDER — ONDANSETRON HCL 4 MG/2ML IJ SOLN
INTRAMUSCULAR | Status: AC
  Filled 2016-08-24: qty 2

## 2016-08-24 MED ORDER — KETOROLAC TROMETHAMINE 30 MG/ML IJ SOLN
INTRAMUSCULAR | Status: DC | PRN
  Administered 2016-08-24: 30 mg via INTRAVENOUS

## 2016-08-24 MED ORDER — FENTANYL CITRATE (PF) 100 MCG/2ML IJ SOLN
INTRAMUSCULAR | Status: AC
  Administered 2016-08-24: 25 ug via INTRAVENOUS
  Filled 2016-08-24: qty 2

## 2016-08-24 MED ORDER — FENTANYL CITRATE (PF) 100 MCG/2ML IJ SOLN
25.0000 ug | INTRAMUSCULAR | Status: DC | PRN
  Administered 2016-08-24 (×2): 25 ug via INTRAVENOUS

## 2016-08-24 MED ORDER — HYDROCODONE-ACETAMINOPHEN 5-325 MG PO TABS: 1.0000 | ORAL_TABLET | ORAL | 0 refills | Status: DC | PRN

## 2016-08-24 MED ORDER — LIDOCAINE HCL (CARDIAC) 20 MG/ML IV SOLN
INTRAVENOUS | Status: AC
  Filled 2016-08-24: qty 5

## 2016-08-24 SURGICAL SUPPLY — 19 items
BIPOLAR CUTTING LOOP 21FR (ELECTRODE)
CANISTER SUCT 3000ML (MISCELLANEOUS) ×3 IMPLANT
CATH ROBINSON RED A/P 16FR (CATHETERS) ×3 IMPLANT
CLOTH BEACON ORANGE TIMEOUT ST (SAFETY) ×3 IMPLANT
CONTAINER PREFILL 10% NBF 60ML (FORM) ×6 IMPLANT
ELECT REM PT RETURN 9FT ADLT (ELECTROSURGICAL) ×3
ELECTRODE REM PT RTRN 9FT ADLT (ELECTROSURGICAL) ×1 IMPLANT
GLOVE BIO SURGEON STRL SZ 6.5 (GLOVE) ×2 IMPLANT
GLOVE BIO SURGEONS STRL SZ 6.5 (GLOVE) ×1
GLOVE BIOGEL PI IND STRL 7.0 (GLOVE) ×2 IMPLANT
GLOVE BIOGEL PI INDICATOR 7.0 (GLOVE) ×4
GOWN STRL REUS W/TWL LRG LVL3 (GOWN DISPOSABLE) ×6 IMPLANT
LOOP CUTTING BIPOLAR 21FR (ELECTRODE) IMPLANT
PACK VAGINAL MINOR WOMEN LF (CUSTOM PROCEDURE TRAY) ×3 IMPLANT
PAD OB MATERNITY 4.3X12.25 (PERSONAL CARE ITEMS) ×3 IMPLANT
TOWEL OR 17X24 6PK STRL BLUE (TOWEL DISPOSABLE) ×6 IMPLANT
TUBING AQUILEX INFLOW (TUBING) ×3 IMPLANT
TUBING AQUILEX OUTFLOW (TUBING) ×3 IMPLANT
WATER STERILE IRR 1000ML POUR (IV SOLUTION) ×3 IMPLANT

## 2016-08-24 NOTE — Brief Op Note (Signed)
08/24/2016  11:48 AM  PATIENT:  Bonnie Carr  35 y.o. female  PRE-OPERATIVE DIAGNOSIS:  DUB / POLYP  POST-OPERATIVE DIAGNOSIS:  * No post-op diagnosis entered *  PROCEDURE:  Procedure(s): DILATATION AND CURETTAGE /HYSTEROSCOPY (N/A)  SURGEON:  Surgeon(s) and Role:    * Essie HartWalda Melroy Bougher, MD - Primary  PHYSICIAN ASSISTANT:   ASSISTANTS: none   ANESTHESIA:   local and general  EBL:  Total I/O In: 1000 [I.V.:1000] Out: 30 [Urine:20; Blood:10]  BLOOD ADMINISTERED:none  DRAINS: none   LOCAL MEDICATIONS USED:  LIDOCAINE  and Amount: 10 ml  SPECIMEN:  Source of Specimen:  endometrial curettings  DISPOSITION OF SPECIMEN:  PATHOLOGY  COUNTS:  YES  TOURNIQUET:  * No tourniquets in log *  DICTATION: .Note written in epic chart  PLAN OF CARE: Discharge to home after PACU  PATIENT DISPOSITION:  PACU - hemodynamically stable.   Delay start of Pharmacological VTE agent (>24hrs) due to surgical blood loss or risk of bleeding: not applicable  Bonnie Carr Bonnie Carr

## 2016-08-24 NOTE — Anesthesia Preprocedure Evaluation (Addendum)
Anesthesia Evaluation  Patient identified by MRN, date of birth, ID band Patient awake    Reviewed: Allergy & Precautions, NPO status , Patient's Chart, lab work & pertinent test results  History of Anesthesia Complications Negative for: history of anesthetic complications  Airway Mallampati: II  TM Distance: >3 FB Neck ROM: Full    Dental  (+) Teeth Intact, Dental Advisory Given   Pulmonary Current Smoker,    Pulmonary exam normal breath sounds clear to auscultation       Cardiovascular Exercise Tolerance: Good negative cardio ROS Normal cardiovascular exam Rhythm:Regular Rate:Normal     Neuro/Psych negative neurological ROS  negative psych ROS   GI/Hepatic Neg liver ROS, GERD (with spicy food, no symptoms currently)  ,  Endo/Other  negative endocrine ROSObesity   Renal/GU negative Renal ROS     Musculoskeletal negative musculoskeletal ROS (+)   Abdominal   Peds  Hematology negative hematology ROS (+)   Anesthesia Other Findings Day of surgery medications reviewed with the patient.  Reproductive/Obstetrics negative OB ROS                            Anesthesia Physical Anesthesia Plan  ASA: II  Anesthesia Plan: General   Post-op Pain Management:    Induction: Intravenous  Airway Management Planned: LMA  Additional Equipment:   Intra-op Plan:   Post-operative Plan: Extubation in OR  Informed Consent: I have reviewed the patients History and Physical, chart, labs and discussed the procedure including the risks, benefits and alternatives for the proposed anesthesia with the patient or authorized representative who has indicated his/her understanding and acceptance.   Dental advisory given  Plan Discussed with: CRNA  Anesthesia Plan Comments: (Risks/benefits of general anesthesia discussed with patient including risk of damage to teeth, lips, gum, and tongue,  nausea/vomiting, allergic reactions to medications, and the possibility of heart attack, stroke and death.  All patient questions answered.  Patient wishes to proceed.)        Anesthesia Quick Evaluation

## 2016-08-24 NOTE — Discharge Instructions (Signed)

## 2016-08-24 NOTE — H&P (Signed)
Bonnie Carr is an 35 y.o. female G1P0010  notes that her menses is "terrible". She has severe dysmenorrhea and has episodes of flooding. She also notes that her menses now comes every 2 weeks for the last 4 months. She stopped BCPs 3 months ago. She is not presently SA.  Pertinent Gynecological History: Menses: flow is excessive with use of 4 pads or tampons on heaviest days Bleeding: dysfunctional uterine bleeding Contraception: OCP (estrogen/progesterone) DES exposure: denies Blood transfusions: none Sexually transmitted diseases: no past history Previous GYN Procedures: none  Last mammogram: n/a  Last pap: normal Date: 07/25/2016 OB History: G1, P0   Menstrual History: Menarche age: [age] Patient's last menstrual period was 07/31/2016 (exact date).    Past Medical History:  Diagnosis Date  . GERD (gastroesophageal reflux disease)   . Irregular heart beats   . Kidney stone   . Kidney stones   . Pre-diabetes   . Trichomonas     Past Surgical History:  Procedure Laterality Date  . CHOLECYSTECTOMY      Family History  Problem Relation Age of Onset  . Hypertension Mother   . Heart disease Mother   . Diabetes Father   . Diabetes Maternal Grandmother     Social History:  reports that she has been smoking Cigarettes.  She has been smoking about 0.50 packs per day. She has never used smokeless tobacco. She reports that she drinks alcohol. She reports that she does not use drugs.  Allergies: No Known Allergies  Prescriptions Prior to Admission  Medication Sig Dispense Refill Last Dose  . acetaminophen (TYLENOL) 500 MG tablet Take 500 mg by mouth every 6 (six) hours as needed for mild pain.   04/08/2016 at Unknown time  . cetirizine (ZYRTEC) 10 MG tablet Take 10 mg by mouth daily as needed for allergies. For allergies   Past Week at Unknown time  . ibuprofen (ADVIL,MOTRIN) 200 MG tablet Take 200 mg by mouth every 6 (six) hours as needed for moderate pain.     . Multiple  Vitamin (MULTIVITAMIN WITH MINERALS) TABS tablet Take 1 tablet by mouth daily.   Past Week at Unknown time  . HYDROcodone-acetaminophen (NORCO) 5-325 MG tablet Take 1-2 tablets by mouth every 6 (six) hours as needed. (Patient not taking: Reported on 08/14/2016) 15 tablet 0 Not Taking at Unknown time  . ondansetron (ZOFRAN) 4 MG tablet Take 1 tablet (4 mg total) by mouth every 8 (eight) hours as needed for nausea or vomiting. (Patient not taking: Reported on 08/14/2016) 6 tablet 0 Not Taking at Unknown time    Review of Systems  Constitutional: Negative.   Eyes: Negative.   Respiratory: Negative.   Cardiovascular: Negative.   Gastrointestinal: Negative.   Skin: Negative.   All other systems reviewed and are negative.   Last menstrual period 07/31/2016, unknown if currently breastfeeding. Physical Exam  Genitourinary:  Genitourinary Comments: Examination: limited by anxiety,  limited by habitus  Vulva: no masses,  no atrophy,  no lesions  Mons: normal,  no erythema,  no excoriation,  no atrophy,  no lesions,  no vesicles/ ulcers,  no masses,  no swelling,  no tenderness  Labia Majora: normal,  no erythema,  no excoriation,  no atrophy,  no discoloration,  no lesions,  no vesicles/ ulcers,  no masses,  no swelling,  no tenderness  Labia Minora: normal,  no erythema,  no excoriation,  no atrophy,  no discoloration,  no lesions,  no vesicles,  no masses,  no swelling,  no tenderness  Introitus: normal  Bartholin's Gland: normal  Vagina: normal,  no discharge,  no blood present,  no erythema,  no atrophy,  no lesions,  no ulcers,  no swelling,  no masses,  no tenderness,  no prolapse  Cervix: grossly normal,  no lesions,  no discharge,  no bleeding,  no cervical motion tenderness,  sample taken for a Pap smear  Uterus: midline,  no uterine prolapse,  mobile,  non-tender,  contour irregular,  enlarged 10 weeks size  Urethral Meatus/ Urethra normal meatus,  no discharge,   well supported urethra,   no masses,  no tenderness  Bladder: non-distended,  no palpable mass,  non-tender  Adnexa/Parametria: no mass palpable,  no tenderness      No results found for this or any previous visit (from the past 24 hour(s)).  No results found.  Assessment/Plan: 35 yo with abnormal uterine bleeding.  Pelvic US  shows one small posterior fibroid, endometrial thickness, possible polyp.  Patient agrees that we should proceed with D&C hysteroscopy / polypectomy.  She was also counseled that this will not help with her dysmenorrhea, and she should consider hormonal management in form of Depo provera or Mirena (or [place]) IUD. Risks of surgery explained to patient. She voiced understanding.  Bonnie Carr 08/24/2016, 10:37 AM

## 2016-08-24 NOTE — Transfer of Care (Signed)
Immediate Anesthesia Transfer of Care Note  Patient: Bonnie Carr  Procedure(s) Performed: Procedure(s): DILATATION AND CURETTAGE /HYSTEROSCOPY (N/A)  Patient Location: PACU  Anesthesia Type:General  Level of Consciousness: awake, alert , oriented and patient cooperative  Airway & Oxygen Therapy: Patient Spontanous Breathing and Patient connected to nasal cannula oxygen  Post-op Assessment: Report given to RN and Post -op Vital signs reviewed and stable  Post vital signs: Reviewed and stable  Last Vitals:  Vitals:   08/24/16 1034  BP: 129/81  Pulse: 65  Resp: (!) 22  Temp: 36.7 C    Last Pain:  Vitals:   08/24/16 1034  TempSrc: Oral  PainSc: 4       Patients Stated Pain Goal: 3 (08/24/16 1034)  Complications: No apparent anesthesia complications

## 2016-08-24 NOTE — Anesthesia Procedure Notes (Signed)
Procedure Name: LMA Insertion Date/Time: 08/24/2016 11:12 AM Performed by: Yolonda KidaARVER, Abdulkarim Eberlin L Pre-anesthesia Checklist: Patient identified, Emergency Drugs available, Suction available and Patient being monitored Patient Re-evaluated:Patient Re-evaluated prior to inductionOxygen Delivery Method: Circle system utilized Preoxygenation: Pre-oxygenation with 100% oxygen Intubation Type: IV induction Ventilation: Mask ventilation without difficulty LMA: LMA inserted LMA Size: 4.0 Number of attempts: 1 Placement Confirmation: positive ETCO2,  CO2 detector and breath sounds checked- equal and bilateral Tube secured with: Tape Dental Injury: Teeth and Oropharynx as per pre-operative assessment

## 2016-08-24 NOTE — Anesthesia Postprocedure Evaluation (Signed)
Anesthesia Post Note  Patient: Celine MansCourtney R Bendix  Procedure(s) Performed: Procedure(s) (LRB): DILATATION AND CURETTAGE /HYSTEROSCOPY (N/A)  Patient location during evaluation: PACU Anesthesia Type: General Level of consciousness: awake and alert Pain management: pain level controlled Vital Signs Assessment: post-procedure vital signs reviewed and stable Respiratory status: spontaneous breathing, nonlabored ventilation, respiratory function stable and patient connected to nasal cannula oxygen Cardiovascular status: blood pressure returned to baseline and stable Postop Assessment: no signs of nausea or vomiting Anesthetic complications: no     Last Vitals:  Vitals:   08/24/16 1300 08/24/16 1330  BP: 100/61 107/65  Pulse: (!) 58 63  Resp: 17 16  Temp: 36.5 C 36.7 C    Last Pain:  Vitals:   08/24/16 1330  TempSrc:   PainSc: 0-No pain   Pain Goal: Patients Stated Pain Goal: 3 (08/24/16 1034)               Phillips Groutarignan, Lake[place]ia [place]

## 2016-08-28 ENCOUNTER — Encounter (HOSPITAL_COMMUNITY): Payer: Self-pay | Admitting: Obstetrics & Gynecology

## 2016-08-28 ENCOUNTER — Other Ambulatory Visit: Payer: Self-pay | Admitting: Obstetrics & Gynecology

## 2016-09-04 NOTE — Op Note (Signed)
Operative Note  Bonnie MansCourtney R Carr female 35 y.o. 08/24/16   Surgeon: Wynonia HazardPINN, Dawanda Mapel [name]   Assistants: none  Pre-operative Diagnosis: 1. Abnormal uterine bleeding 2. Endometrial polyp on ultrasound  Post-operative Diagnosis: None   Procedure Procedure(s) and Anesthesia Type:    * DILATATION AND CURETTAGE /HYSTEROSCOPY - Choice  Anesthesia: General LMA anesthesia and Local anesthesia 1% plain lidocaine  ASA Class: 2  EBL: 30 mL  UOP: 50mL  Specimen: Endometrial curettings  Findings: Exam under anesthesia: Normal vulva vaginal vault: Normal Cervix: grossly normal uterus:10 weeks size mobile anteverted, no palpable masses. Adnexa: no palpable masses Hysteroscopy: normal endometrial and endocervical canal  Complications: None  After adequate anesthesia was achieved, the patient was prepped and draped in the usual sterile fashion.  The speculum was placed in the vagina and the cervix stabilized with a single-tooth tenaculum.  The  cervix was dilated with Hank dilators and sounded to 8 cm. The hysteroscope passed inside the endometrial cavity. The above findings were noted and sharp curettage was then performed and uterine curettings sent to path.  All instruments were removed from the vagina.  The patient tolerated the procedure well.     Jerick Khachatryan [name]

## 2017-02-19 ENCOUNTER — Emergency Department (HOSPITAL_BASED_OUTPATIENT_CLINIC_OR_DEPARTMENT_OTHER)
Admission: EM | Admit: 2017-02-19 | Discharge: 2017-02-19 | Disposition: A | Discharge status: Home / Self Care | Payer: BLUE CROSS/BLUE SHIELD | Attending: Emergency Medicine | Admitting: Emergency Medicine

## 2017-02-19 ENCOUNTER — Encounter (HOSPITAL_BASED_OUTPATIENT_CLINIC_OR_DEPARTMENT_OTHER): Payer: Self-pay | Admitting: Emergency Medicine

## 2017-02-19 ENCOUNTER — Emergency Department (HOSPITAL_BASED_OUTPATIENT_CLINIC_OR_DEPARTMENT_OTHER): Payer: BLUE CROSS/BLUE SHIELD

## 2017-02-19 DIAGNOSIS — K30 Functional dyspepsia: Secondary | ICD-10-CM | POA: Diagnosis not present

## 2017-02-19 DIAGNOSIS — R0789 Other chest pain: Secondary | ICD-10-CM | POA: Insufficient documentation

## 2017-02-19 DIAGNOSIS — F17210 Nicotine dependence, cigarettes, uncomplicated: Secondary | ICD-10-CM | POA: Insufficient documentation

## 2017-02-19 LAB — BASIC METABOLIC PANEL
Anion gap: 6 (ref 5–15)
BUN: 12 mg/dL (ref 6–20)
CO2: 24 mmol/L (ref 22–32)
Calcium: 8.8 mg/dL — ABNORMAL LOW (ref 8.9–10.3)
Chloride: 105 mmol/L (ref 101–111)
Creatinine, Ser: 0.81 mg/dL (ref 0.44–1.00)
GFR calc Af Amer: >60 mL/min (ref >60)
GLUCOSE: 97 mg/dL (ref 65–99)
POTASSIUM: 3.5 mmol/L (ref 3.5–5.1)
Sodium: 135 mmol/L (ref 135–145)

## 2017-02-19 LAB — CBC WITH DIFFERENTIAL/PLATELET
Basophils Absolute: 0 10*3/uL (ref 0.0–0.1)
Basophils Relative: 0 %
EOS PCT: 1 %
Eosinophils Absolute: 0.1 10*3/uL (ref 0.0–0.7)
HEMATOCRIT: 35.8 % — AB (ref 36.0–46.0)
Hemoglobin: 11.9 g/dL — ABNORMAL LOW (ref 12.0–15.0)
LYMPHS ABS: 2.9 10*3/uL (ref 0.7–4.0)
LYMPHS PCT: 38 %
MCH: 28.3 pg (ref 26.0–34.0)
MCHC: 33.2 g/dL (ref 30.0–36.0)
MCV: 85.2 fL (ref 78.0–100.0)
MONO ABS: 0.7 10*3/uL (ref 0.1–1.0)
Monocytes Relative: 9 %
NEUTROS ABS: 3.9 10*3/uL (ref 1.7–7.7)
Neutrophils Relative %: 52 %
PLATELETS: 414 10*3/uL — AB (ref 150–400)
RBC: 4.2 MIL/uL (ref 3.87–5.11)
RDW: 14.1 % (ref 11.5–15.5)
WBC: 7.6 10*3/uL (ref 4.0–10.5)

## 2017-02-19 LAB — PREGNANCY, URINE: Preg Test, Ur: NEGATIVE

## 2017-02-19 LAB — TROPONIN I: Troponin I: 0.03 ng/mL (ref <0.03)

## 2017-02-19 LAB — D-DIMER, QUANTITATIVE: D-Dimer, Quant: 0.33 ug/mL-FEU (ref 0.00–0.50)

## 2017-02-19 MED ORDER — PANTOPRAZOLE SODIUM 40 MG PO TBEC: DELAYED_RELEASE_TABLET | ORAL | 0 refills | Status: DC

## 2017-02-19 MED ORDER — GI COCKTAIL ~~LOC~~
30.0000 mL | Freq: Once | ORAL | Status: AC
  Administered 2017-02-19: 30 mL via ORAL
  Filled 2017-02-19: qty 30

## 2017-02-19 NOTE — ED Triage Notes (Signed)
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 At about 1 pm, she started to have central chest pain with radiation to her left ribs and lower back. The patient reports that she was SOB when it started.

## 2017-02-19 NOTE — ED Provider Notes (Addendum)
[facility] Provider Note   CSN: [id number] Arrival date & time: 02/19/17  0058     History   Chief Complaint Chief Complaint  Patient presents with  . Chest Pain    HPI Bonnie Carr is a 36 y.o. female.  The history is provided by the patient.  Chest Pain   This is a recurrent problem. The current episode started 12 to 24 hours ago. The problem occurs constantly. The problem has not changed since onset.Associated with: none. The pain is present in the substernal region. The pain is moderate. The quality of the pain is described as burning. Exacerbated by: none. Pertinent negatives include no abdominal pain, no back pain, no claudication, no cough, no diaphoresis, no dizziness, no exertional chest pressure, no fever, no headaches, no hemoptysis, no irregular heartbeat, no leg pain, no lower extremity edema, no malaise/fatigue, no nausea, no near-syncope, no numbness, no orthopnea, no palpitations, no PND, no sputum production, no syncope, no vomiting and no weakness. She has tried nothing for the symptoms. The treatment provided no relief. Risk factors include obesity.  Her past medical history is significant for MI.  Was on a plane trip to [facility]  Past Medical History:  Diagnosis Date  . GERD (gastroesophageal reflux disease)   . Irregular heart beats   . Kidney stone   . Kidney stones   . Pre-diabetes   . Trichomonas     Patient Active Problem List   Diagnosis Date Noted  . NAUSEA AND VOMITING 05/23/2009  . RUQ PAIN 05/17/2009    Past Surgical History:  Procedure Laterality Date  . CHOLECYSTECTOMY    . HYSTEROSCOPY W/D&C N/A 08/24/2016   Procedure: DILATATION AND CURETTAGE /HYSTEROSCOPY;  Surgeon: Essie Hart, MD;  Location: [facility];  Service: Gynecology;  Laterality: N/A;    OB History    Gravida Para Term Preterm AB Living   3       2     SAB TAB Ectopic Multiple Live Births     2             Home Medications    Prior to Admission medications    Medication Sig Start Date End Date Taking? Authorizing Provider  cetirizine (ZYRTEC) 10 MG tablet Take 10 mg by mouth daily.   Yes Historical Provider, MD  HYDROcodone-acetaminophen (NORCO) 5-325 MG tablet Take 1-2 tablets by mouth every 4 (four) hours as needed for severe pain. 08/24/16   Essie Hart, MD  ibuprofen (ADVIL,MOTRIN) 200 MG tablet Take 3 tablets (600 mg total) by mouth every 6 (six) hours as needed for moderate pain. 08/24/16   Essie Hart, MD  Multiple Vitamin (MULTIVITAMIN WITH MINERALS) TABS tablet Take 1 tablet by mouth daily.    Historical Provider, MD    Family History Family History  Problem Relation Age of Onset  . Hypertension Mother   . Heart disease Mother   . Diabetes Father   . Diabetes Maternal Grandmother     Social History Social History  Substance Use Topics  . Smoking status: Current Some Day Smoker    Packs/day: 0.50    Types: Cigarettes  . Smokeless tobacco: Never Used  . Alcohol use Yes     Comment: wine occassioanlly     Allergies   Patient has no known allergies.   Review of Systems Review of Systems  Constitutional: Negative for diaphoresis, fever and malaise/fatigue.  Respiratory: Negative for cough, hemoptysis, sputum production, chest tightness, wheezing and stridor.   Cardiovascular:  Positive for chest pain. Negative for palpitations, orthopnea, claudication, leg swelling, syncope, PND and near-syncope.  Gastrointestinal: Negative for abdominal distention, abdominal pain, anal bleeding, diarrhea, nausea and vomiting.  Musculoskeletal: Negative for back pain.  Neurological: Negative for dizziness, weakness, numbness and headaches.  All other systems reviewed and are negative.    Physical Exam Updated Vital Signs BP 101/56   Pulse 68   Temp 98.1 F (36.7 C) (Oral)   Resp 18   LMP 01/06/2017   SpO2 99%   Physical Exam  Constitutional: She is oriented to person, place, and time. She appears well-developed and well-nourished.  No distress.  HENT:  Head: Normocephalic and atraumatic.  Right Ear: External ear normal.  Left Ear: External ear normal.  Mouth/Throat: Oropharynx is clear and moist. No oropharyngeal exudate.  Eyes: Conjunctivae and EOM are normal. Pupils are equal, round, and reactive to light.  Neck: Normal range of motion. Neck supple. No JVD present.  Cardiovascular: Normal rate, regular rhythm and intact distal pulses.   Pulmonary/Chest: Effort normal and breath sounds normal. No stridor. No respiratory distress. She has no wheezes. She has no rales.  Abdominal: Soft. She exhibits no mass. There is no tenderness. There is no rebound and no guarding.  Hyperactive in the epigastrum  Musculoskeletal: Normal range of motion. She exhibits no edema or tenderness.  Negative Homan's sign  Neurological: She is alert and oriented to person, place, and time.  Skin: Skin is warm and dry. Capillary refill takes less than 2 seconds.  Psychiatric: She has a normal mood and affect.     ED Treatments / Results   Vitals:   02/19/17 0230 02/19/17 0245  BP: 105/70 101/56  Pulse: 69 68  Resp:    Temp:     Results for orders placed or performed during the hospital encounter of 02/19/17  Pregnancy, urine  Result Value Ref Range   Preg Test, Ur NEGATIVE NEGATIVE  CBC with Differential/Platelet  Result Value Ref Range   WBC 7.6 4.0 - 10.5 K/uL   RBC 4.20 3.87 - 5.11 MIL/uL   Hemoglobin 11.9 (L) 12.0 - 15.0 g/dL   HCT 16.1 (L) 09.6 - 04.5 %   MCV 85.2 78.0 - 100.0 fL   MCH 28.3 26.0 - 34.0 pg   MCHC 33.2 30.0 - 36.0 g/dL   RDW 40.9 81.1 - 91.4 %   Platelets 414 (H) 150 - 400 K/uL   Neutrophils Relative % 52 %   Neutro Abs 3.9 1.7 - 7.7 K/uL   Lymphocytes Relative 38 %   Lymphs Abs 2.9 0.7 - 4.0 K/uL   Monocytes Relative 9 %   Monocytes Absolute 0.7 0.1 - 1.0 K/uL   Eosinophils Relative 1 %   Eosinophils Absolute 0.1 0.0 - 0.7 K/uL   Basophils Relative 0 %   Basophils Absolute 0.0 0.0 - 0.1 K/uL    Basic metabolic panel  Result Value Ref Range   Sodium 135 135 - 145 mmol/L   Potassium 3.5 3.5 - 5.1 mmol/L   Chloride 105 101 - 111 mmol/L   CO2 24 22 - 32 mmol/L   Glucose, Bld 97 65 - 99 mg/dL   BUN 12 6 - 20 mg/dL   Creatinine, Ser 7.82 0.44 - 1.00 mg/dL   Calcium 8.8 (L) 8.9 - 10.3 mg/dL   GFR calc non Af Amer >60 >60 mL/min   GFR calc Af Amer >60 >60 mL/min   Anion gap 6 5 - 15  D-dimer, quantitative (not  at [facility])  Result Value Ref Range   D-Dimer, Quant 0.33 0.00 - 0.50 ug/mL-FEU   Dg Chest 2 View  Result Date: 02/19/2017 CLINICAL DATA:  Mid chest pain EXAM: CHEST  2 VIEW COMPARISON:  04/09/2016 FINDINGS: The heart size and mediastinal contours are within normal limits. Both lungs are clear. The visualized skeletal structures are unremarkable. Surgical clips in the right upper quadrant IMPRESSION: No active cardiopulmonary disease. Electronically Signed   By: Jasmine PangKim  Fujinaga M.D.   On: 02/19/2017 01:29     EKG  EKG Interpretation  Date/Time:  Tuesday February 19 2017 01:08:54 EST Ventricular Rate:  75 PR Interval:  152 QRS Duration: 80 QT Interval:  352 QTC Calculation: 393 R Axis:   83 Text Interpretation:  Normal sinus rhythm Confirmed by [facility]  MD, Collyns Mcquigg ([id number]) on 02/19/2017 2:22:13 AM       Radiology Dg Chest 2 View  Result Date: 02/19/2017 CLINICAL DATA:  Mid chest pain EXAM: CHEST  2 VIEW COMPARISON:  04/09/2016 FINDINGS: The heart size and mediastinal contours are within normal limits. Both lungs are clear. The visualized skeletal structures are unremarkable. Surgical clips in the right upper quadrant IMPRESSION: No active cardiopulmonary disease. Electronically Signed   By: Jasmine PangKim  Fujinaga M.D.   On: 02/19/2017 01:29    Procedures Procedures (including critical care time)  Medications Ordered in ED Medications  gi cocktail (Maalox,Lidocaine,Donnatal) (not administered)      Ruled out for MI with a normal EKG and troponin and > 8 hours of  ongoing pain.  HEART score one low risk for MACE.  Has had negative stress test for same.  Does have GERD and delayed gastric emptying on test but is not taking GERD medication.  I suspect this is GERD and some anxiety related to family history.  rULED OUT FOR pe WITH NEGATIVE ddIMER IN THE ED  Final Clinical Impressions(s) / ED Diagnoses  Non cardiac chest pain:  All questions answered to patient's satisfaction. Based on history and exam patient has been appropriately medically screened and emergency conditions excluded. Patient is stable for discharge at this time. Strict return precautions given for chest pain, weakness numbness, neck pain shoulder pain passing out, shortness of breath, difficulty speaking or swallowing or any further problems or concerns New Prescriptions New Prescriptions   No medications on file     Arren Laminack, MD 02/19/17 0342    Roshell Brigham, MD 02/19/17 [phone]

## 2017-02-19 NOTE — ED Notes (Addendum)
C/o recurrent episodes of fluctuating CP, has been seen here for the same, has followed up with [facility], "frustrated d/t multiple frequent normal work-ups", "worried d/t father had MI, mother recently died of MI", pt alert, NAD, calm, interactive, resps e/u, speaking in clear complete sentences, no dyspnea noted, skin W&D, VSS, (denies: fever, nvd, cough congestion, cold sx, urinary sx, vaginal sx, GI sx, sob, dizziness or visual changes), [facility] No meds PTA. Last ate 1500, last BM usual for her (soft) at 2300. Last episode 1 year ago.

## 2017-02-19 NOTE — ED Notes (Signed)
[facility] updating [facility] on results, meds and plan.

## 2017-02-19 NOTE — ED Notes (Signed)
Up to br

## 2017-12-25 ENCOUNTER — Encounter (HOSPITAL_COMMUNITY): Payer: Self-pay | Admitting: *Deleted

## 2017-12-25 ENCOUNTER — Inpatient Hospital Stay (HOSPITAL_COMMUNITY)
Admission: AD | Admit: 2017-12-25 | Discharge: 2017-12-25 | Disposition: A | Discharge status: Home / Self Care | Payer: BLUE CROSS/BLUE SHIELD | Source: Ambulatory Visit | Attending: Obstetrics & Gynecology | Admitting: Obstetrics & Gynecology

## 2017-12-25 ENCOUNTER — Inpatient Hospital Stay (HOSPITAL_COMMUNITY): Payer: BLUE CROSS/BLUE SHIELD

## 2017-12-25 DIAGNOSIS — M549 Dorsalgia, unspecified: Secondary | ICD-10-CM

## 2017-12-25 DIAGNOSIS — O26891 Other specified pregnancy related conditions, first trimester: Secondary | ICD-10-CM | POA: Diagnosis present

## 2017-12-25 DIAGNOSIS — O99891 Other specified diseases and conditions complicating pregnancy: Secondary | ICD-10-CM

## 2017-12-25 DIAGNOSIS — Z3A10 10 weeks gestation of pregnancy: Secondary | ICD-10-CM | POA: Diagnosis not present

## 2017-12-25 DIAGNOSIS — O26899 Other specified pregnancy related conditions, unspecified trimester: Secondary | ICD-10-CM

## 2017-12-25 DIAGNOSIS — O9989 Other specified diseases and conditions complicating pregnancy, childbirth and the puerperium: Secondary | ICD-10-CM

## 2017-12-25 DIAGNOSIS — R109 Unspecified abdominal pain: Secondary | ICD-10-CM | POA: Diagnosis not present

## 2017-12-25 DIAGNOSIS — O209 Hemorrhage in early pregnancy, unspecified: Secondary | ICD-10-CM

## 2017-12-25 LAB — URINALYSIS, ROUTINE W REFLEX MICROSCOPIC
BILIRUBIN URINE: NEGATIVE
GLUCOSE, UA: NEGATIVE mg/dL
HGB URINE DIPSTICK: NEGATIVE
Ketones, ur: NEGATIVE mg/dL
Leukocytes, UA: NEGATIVE
Nitrite: NEGATIVE
PH: 5 (ref 5.0–8.0)
Protein, ur: NEGATIVE mg/dL
SPECIFIC GRAVITY, URINE: 1.011 (ref 1.005–1.030)

## 2017-12-25 NOTE — Discharge Instructions (Signed)

## 2017-12-25 NOTE — MAU Note (Signed)
Pt C/O lower back & lower abd pain for the last 2 days, denies bleeding.  Has already started care @ [facility].

## 2017-12-25 NOTE — MAU Note (Addendum)
History   36 y/o G4P0030 @ [address] d by LMP c/o if left lower back pain (chronic) and constant lower abdominal pain. Lower abdominal pain is bilateral, constant, dull, 5/10 intensity for past two days.  Lower back pain is intermittent, left sided, sharp, 4/10 intensity, present for a long time, years.   Hx of 1 SAB then 2 ETOPs.   Patient Active Problem List   Diagnosis Date Noted  . NAUSEA AND VOMITING 05/23/2009  . RUQ PAIN 05/17/2009    Chief Complaint  Patient presents with  . Abdominal Pain  . Back Pain   HPI  OB History    Gravida Para Term Preterm AB Living   4       3     SAB TAB Ectopic Multiple Live Births   1 2            Past Medical History:  Diagnosis Date  . GERD (gastroesophageal reflux disease)   . Irregular heart beats   . Kidney stone   . Kidney stones   . Pre-diabetes   . Trichomonas     Past Surgical History:  Procedure Laterality Date  . CHOLECYSTECTOMY    . HYSTEROSCOPY W/D&C N/A 08/24/2016   Procedure: DILATATION AND CURETTAGE /HYSTEROSCOPY;  Surgeon: Essie HartWalda Pinn, MD;  Location: [facility];  Service: Gynecology;  Laterality: N/A;    Family History  Problem Relation Age of Onset  . Hypertension Mother   . Heart disease Mother   . Diabetes Father   . Diabetes Maternal Grandmother     Social History   Tobacco Use  . Smoking status: Former Smoker    Packs/day: 0.50    Types: Cigarettes    Last attempt to quit: 10/31/2017    Years since quitting: 0.1  . Smokeless tobacco: Never Used  Substance Use Topics  . Alcohol use: Yes    Comment: wine occassioanlly  . Drug use: No    Allergies: No Known Allergies  Medications Prior to Admission  Medication Sig Dispense Refill Last Dose  . cetirizine (ZYRTEC) 10 MG tablet Take 10 mg by mouth daily.     Marland Kitchen. HYDROcodone-acetaminophen (NORCO) 5-325 MG tablet Take 1-2 tablets by mouth every 4 (four) hours as needed for severe pain. 15 tablet 0   . ibuprofen (ADVIL,MOTRIN) 200 MG tablet Take 3  tablets (600 mg total) by mouth every 6 (six) hours as needed for moderate pain. 60 tablet 0   . Multiple Vitamin (MULTIVITAMIN WITH MINERALS) TABS tablet Take 1 tablet by mouth daily.   08/23/2016 at Unknown time  . pantoprazole (PROTONIX) 40 MG tablet 1 tab po daily 30 tablet 0     ROS  Constitutional: Denies fevers/chills Cardiovascular: Denies chest pain or palpitations Pulmonary: Denies coughing or wheezing Gastrointestinal: Denies nausea, vomiting or diarrhea Genitourinary: Denies pelvic pain, unusual vaginal bleeding, unusual vaginal discharge, dysuria, urgency or frequency.  Musculoskeletal: Denies muscle or joint aches and pain.  Neurology: Denies abnormal sensations such as tingling or numbness.   Physical Exam  Physical Exam  Constitutional: She is oriented to person, place, and time. She appears well-developed and well-nourished.  HENT:  Head: Normocephalic and atraumatic.  Neck: Normal range of motion.  GI: Soft. Bowel sounds are normal. Non tender.   Neurological: She is alert and oriented to person, place, and time.  Skin: Skin is warm and dry.  Psychiatric: She has a normal mood and affect. Her behavior is normal.  Blood pressure 118/70, pulse 69, temperature  98.2 F (36.8 C), temperature source Oral, resp. rate 16, height 5\' 6"  (1.676 m), weight 114.8 kg (253 lb), last menstrual period 10/14/2017, unknown if currently breastfeeding. Cervix: closed, thick.   Pelvic OB US: Viable IUP at 11 W EGA, +FHB.  NO adnexal masses.    Recent GC/chlam tests done in office: Neg/neg.   Urinalysis    Component Value Date/Time   COLORURINE YELLOW 12/25/2017 1720   APPEARANCEUR CLEAR 12/25/2017 1720   LABSPEC 1.011 12/25/2017 1720   PHURINE 5.0 12/25/2017 1720   GLUCOSEU NEGATIVE 12/25/2017 1720   HGBUR NEGATIVE 12/25/2017 1720   BILIRUBINUR NEGATIVE 12/25/2017 1720   KETONESUR NEGATIVE 12/25/2017 1720   PROTEINUR NEGATIVE 12/25/2017 1720   UROBILINOGEN 0.2 03/02/2015 1638    NITRITE NEGATIVE 12/25/2017 1720   LEUKOCYTESUR NEGATIVE 12/25/2017 1720     ED Course Patient had an US that showed a viable IUP c/w dates. She reported feeling better.   Assessment: First trimester abdominal pain, normal exam and pelvic US, stable and improved condition  Plan:  Tylenol use prn advised Pain and bleeding precautions reviewed.  Office f/u for new OB work up as scheduled.   Konrad FelixKULWA,[name] MD 12/25/2017 6:01 PM

## 2017-12-31 NOTE — L&D Delivery Note (Signed)
Delivery Note At 0213  a non-viable female was delivered via  (Presentation: vtx ;  ).  APGAR: , ; weight  .  1 lb 2.3 oz Placenta status: sent to pathology Anesthesia:  Epidural Episiotomy:   Lacerations:   Suture Repair:  Est. Blood Loss (mL):  1200 cc weighed  Mom to high risk OB unit.  Baby to with parents.  Lori A Clemmons CNM 03/16/2018, 2:51 AM

## 2018-01-09 ENCOUNTER — Encounter (HOSPITAL_COMMUNITY): Payer: Self-pay | Admitting: Emergency Medicine

## 2018-01-09 ENCOUNTER — Inpatient Hospital Stay (HOSPITAL_COMMUNITY)
Admission: AD | Admit: 2018-01-09 | Discharge: 2018-01-09 | Disposition: A | Discharge status: Home / Self Care | Payer: BLUE CROSS/BLUE SHIELD | Source: Ambulatory Visit | Attending: Obstetrics and Gynecology | Admitting: Obstetrics and Gynecology

## 2018-01-09 DIAGNOSIS — O99611 Diseases of the digestive system complicating pregnancy, first trimester: Secondary | ICD-10-CM | POA: Insufficient documentation

## 2018-01-09 DIAGNOSIS — R7303 Prediabetes: Secondary | ICD-10-CM | POA: Insufficient documentation

## 2018-01-09 DIAGNOSIS — I498 Other specified cardiac arrhythmias: Secondary | ICD-10-CM | POA: Diagnosis not present

## 2018-01-09 DIAGNOSIS — O9989 Other specified diseases and conditions complicating pregnancy, childbirth and the puerperium: Secondary | ICD-10-CM | POA: Diagnosis not present

## 2018-01-09 DIAGNOSIS — I499 Cardiac arrhythmia, unspecified: Secondary | ICD-10-CM

## 2018-01-09 DIAGNOSIS — Z87891 Personal history of nicotine dependence: Secondary | ICD-10-CM | POA: Diagnosis not present

## 2018-01-09 DIAGNOSIS — O99411 Diseases of the circulatory system complicating pregnancy, first trimester: Secondary | ICD-10-CM | POA: Diagnosis present

## 2018-01-09 DIAGNOSIS — Z3A12 12 weeks gestation of pregnancy: Secondary | ICD-10-CM | POA: Insufficient documentation

## 2018-01-09 DIAGNOSIS — Z87442 Personal history of urinary calculi: Secondary | ICD-10-CM | POA: Diagnosis not present

## 2018-01-09 DIAGNOSIS — K219 Gastro-esophageal reflux disease without esophagitis: Secondary | ICD-10-CM | POA: Diagnosis not present

## 2018-01-09 LAB — URINALYSIS, ROUTINE W REFLEX MICROSCOPIC
Bilirubin Urine: NEGATIVE
GLUCOSE, UA: NEGATIVE mg/dL
Hgb urine dipstick: NEGATIVE
Ketones, ur: NEGATIVE mg/dL
LEUKOCYTES UA: NEGATIVE
NITRITE: NEGATIVE
PH: 5 (ref 5.0–8.0)
PROTEIN: NEGATIVE mg/dL
Specific Gravity, Urine: 1.024 (ref 1.005–1.030)

## 2018-01-09 NOTE — MAU Note (Signed)
Pt reports she feels like her heart is beating irregularly. States it "goes off the rhythm it is normally on". Denies other complaints.

## 2018-01-09 NOTE — MAU Provider Note (Signed)
History     CSN: [id number]  Arrival date & time 01/09/18  1813   None     Chief Complaint  Patient presents with  . Irregular Heart Beat    37 y.o. G4P0030 @ [redacted]w[redacted]d presents stating that she felt an irregular heartbeat earlier today. She states she had this problem before pregnancy and has seen a cardiologist in the past. Deemed "OK"    Past Medical History:  Diagnosis Date  . GERD (gastroesophageal reflux disease)   . Irregular heart beats   . Kidney stone   . Kidney stones   . Pre-diabetes   . Trichomonas     Past Surgical History:  Procedure Laterality Date  . CHOLECYSTECTOMY    . HYSTEROSCOPY W/D&C N/A 08/24/2016   Procedure: DILATATION AND CURETTAGE /HYSTEROSCOPY;  Surgeon: Essie Hart, MD;  Location: [facility];  Service: Gynecology;  Laterality: N/A;    Family History  Problem Relation Age of Onset  . Hypertension Mother   . Heart disease Mother   . Diabetes Father   . Diabetes Maternal Grandmother     Social History   Tobacco Use  . Smoking status: Former Smoker    Packs/day: 0.50    Types: Cigarettes    Last attempt to quit: 10/31/2017    Years since quitting: 0.1  . Smokeless tobacco: Never Used  Substance Use Topics  . Alcohol use: Yes    Comment: wine occassioanlly  . Drug use: No    OB History    Gravida Para Term Preterm AB Living   4       3     SAB TAB Ectopic Multiple Live Births   1 2            Review of Systems  Cardiovascular:       Skipping beats  All other systems reviewed and are negative.   Allergies  Patient has no known allergies.  Home Medications    BP (!) 104/58   Pulse 75   Temp 98.8 F (37.1 C) (Oral)   Resp 16   Ht 5\' 6"  (1.676 m)   Wt 253 lb (114.8 kg)   LMP 10/14/2017   SpO2 97%   BMI 40.84 kg/m   Physical Exam  Constitutional: She is oriented to person, place, and time. She appears well-developed and well-nourished. No distress.  HENT:  Head: Normocephalic and atraumatic.  Neck: Normal range of  motion.  Cardiovascular: Normal rate, regular rhythm and normal heart sounds.  Pulmonary/Chest: Effort normal. No respiratory distress.  Abdominal: Soft.  Musculoskeletal: Normal range of motion.  Neurological: She is alert and oriented to person, place, and time.  Skin: Skin is warm and dry.  Psychiatric: She has a normal mood and affect. Her behavior is normal. Thought content normal.  Nursing note and vitals reviewed.  Orders placed or performed during the hospital encounter of 01/09/18  . ED EKG  . ED EKG  . EKG 12-Lead  . EKG 12-Lead  Normal sinus rhythm. Sinus arrythmia. Normal ekg Urinalysis    Component Value Date/Time   COLORURINE YELLOW 01/09/2018 1852   APPEARANCEUR CLEAR 01/09/2018 1852   LABSPEC 1.024 01/09/2018 1852   PHURINE 5.0 01/09/2018 1852   GLUCOSEU NEGATIVE 01/09/2018 1852   HGBUR NEGATIVE 01/09/2018 1852   BILIRUBINUR NEGATIVE 01/09/2018 1852   KETONESUR NEGATIVE 01/09/2018 1852   PROTEINUR NEGATIVE 01/09/2018 1852   UROBILINOGEN 0.2 03/02/2015 1638   NITRITE NEGATIVE 01/09/2018 1852   LEUKOCYTESUR NEGATIVE 01/09/2018 1852  MAU Course     MDM  EKG done; EKG normal with sinus arhythmia Pt in no distress. States she does not feel irregular beats now and has had this happen to her prior to pregnancy. She has regular scheduled appointment on Tuesday 1/15 and I will send note for triage to schedule a cardiology consult. Pt will be discharged to home and told to [facility] if having chest pain, difficulty breathing or any symptoms related to irregular heartbeat.  Illene BolusLori Maggy Wyble CNM

## 2018-01-09 NOTE — Discharge Instructions (Signed)
Palpitations A palpitation is the feeling that your heartbeat is irregular or is faster than normal. It may feel like your heart is fluttering or skipping a beat. Palpitations are usually not a serious problem. They may be caused by many things, including smoking, caffeine, alcohol, stress, and certain medicines. Although most causes of palpitations are not serious, palpitations can be a sign of a serious medical problem. In some cases, you may need further medical evaluation. Follow these instructions at home: Pay attention to any changes in your symptoms. Take these actions to help with your condition:  Avoid the following: ? Caffeinated coffee, tea, soft drinks, diet pills, and energy drinks. ? Chocolate. ? Alcohol.  Do not use any tobacco products, such as cigarettes, chewing tobacco, and e-cigarettes. If you need help quitting, ask your health care provider.  Try to reduce your stress and anxiety. Things that can help you relax include: ? Yoga. ? Meditation. ? Physical activity, such as swimming, jogging, or walking. ? Biofeedback. This is a method that helps you learn to use your mind to control things in your body, such as your heartbeats.  Get plenty of rest and sleep.  Take over-the-counter and prescription medicines only as told by your health care provider.  Keep all follow-up visits as told by your health care provider. This is important.  Contact a health care provider if:  You continue to have a fast or irregular heartbeat after 24 hours.  Your palpitations occur more often. Get help right away if:  You have chest pain or shortness of breath.  You have a severe headache.  You feel dizzy or you faint. This information is not intended to replace advice given to you by your health care provider. Make sure you discuss any questions you have with your health care provider. Document Released: 12/14/2000 Document Revised: 05/21/2016 Document Reviewed: 09/01/2015 Elsevier  Interactive Patient Education  2018 [employer] Inc.  

## 2018-02-20 ENCOUNTER — Inpatient Hospital Stay (HOSPITAL_COMMUNITY)
Admission: AD | Admit: 2018-02-20 | Discharge: 2018-02-20 | Disposition: A | Discharge status: Home / Self Care | Payer: BLUE CROSS/BLUE SHIELD | Source: Ambulatory Visit | Attending: Obstetrics and Gynecology | Admitting: Obstetrics and Gynecology

## 2018-02-20 DIAGNOSIS — O26892 Other specified pregnancy related conditions, second trimester: Secondary | ICD-10-CM | POA: Insufficient documentation

## 2018-02-20 DIAGNOSIS — Z3A18 18 weeks gestation of pregnancy: Secondary | ICD-10-CM | POA: Insufficient documentation

## 2018-02-20 DIAGNOSIS — O99612 Diseases of the digestive system complicating pregnancy, second trimester: Secondary | ICD-10-CM | POA: Insufficient documentation

## 2018-02-20 DIAGNOSIS — Z87891 Personal history of nicotine dependence: Secondary | ICD-10-CM | POA: Insufficient documentation

## 2018-02-20 DIAGNOSIS — Z87442 Personal history of urinary calculi: Secondary | ICD-10-CM | POA: Diagnosis not present

## 2018-02-20 DIAGNOSIS — Z9049 Acquired absence of other specified parts of digestive tract: Secondary | ICD-10-CM | POA: Diagnosis not present

## 2018-02-20 DIAGNOSIS — M545 Low back pain: Secondary | ICD-10-CM | POA: Diagnosis present

## 2018-02-20 DIAGNOSIS — K5900 Constipation, unspecified: Secondary | ICD-10-CM | POA: Insufficient documentation

## 2018-02-20 DIAGNOSIS — Z79899 Other long term (current) drug therapy: Secondary | ICD-10-CM | POA: Diagnosis not present

## 2018-02-20 DIAGNOSIS — R7303 Prediabetes: Secondary | ICD-10-CM | POA: Diagnosis not present

## 2018-02-20 DIAGNOSIS — K219 Gastro-esophageal reflux disease without esophagitis: Secondary | ICD-10-CM | POA: Insufficient documentation

## 2018-02-20 DIAGNOSIS — K625 Hemorrhage of anus and rectum: Secondary | ICD-10-CM | POA: Insufficient documentation

## 2018-02-20 LAB — URINALYSIS, ROUTINE W REFLEX MICROSCOPIC
Bilirubin Urine: NEGATIVE
Glucose, UA: NEGATIVE mg/dL
Hgb urine dipstick: NEGATIVE
KETONES UR: NEGATIVE mg/dL
Nitrite: NEGATIVE
PROTEIN: NEGATIVE mg/dL
Specific Gravity, Urine: 1.004 — ABNORMAL LOW (ref 1.005–1.030)
pH: 7 (ref 5.0–8.0)

## 2018-02-20 NOTE — MAU Note (Signed)
Pt complaining of light spotting and blood in stool since February 12th. Was seen previously in clinic for same but is still concerned.  Also having lower side and back pain rated 6/10.

## 2018-02-20 NOTE — MAU Provider Note (Signed)
Chief Complaint:  Back Pain   None    HPI: Bonnie Carr is a 37 y.o. G4P0030 at [redacted]w[redacted]d who presents to maternity admissions reporting spotting after a bowl movement.  States no spotting in underwear. Has some right sided pain in back for last month.   Location: rectal spotting Quality: small  Duration: beg of Feb Timing: with BM Modifying factors: tried otc for hemmoroids   Denies contractions, leakage of fluid or vaginal bleeding. Good fetal movement.   Pregnancy Course:   Past Medical History:  Diagnosis Date  . GERD (gastroesophageal reflux disease)   . Irregular heart beats   . Kidney stone   . Kidney stones   . Pre-diabetes   . Trichomonas    OB History  Gravida Para Term Preterm AB Living  4       3    SAB TAB Ectopic Multiple Live Births  1 2          # Outcome Date GA Lbr Len/2nd Weight Sex Delivery Anes PTL Lv  4 Current           3 SAB           2 TAB              Birth Comments: System Generated. Please review and update pregnancy details.  1 TAB              Past Surgical History:  Procedure Laterality Date  . CHOLECYSTECTOMY    . HYSTEROSCOPY W/D&C N/A 08/24/2016   Procedure: DILATATION AND CURETTAGE /HYSTEROSCOPY;  Surgeon: Essie Hart, MD;  Location: [facility];  Service: Gynecology;  Laterality: N/A;   Family History  Problem Relation Age of Onset  . Hypertension Mother   . Heart disease Mother   . Diabetes Father   . Diabetes Maternal Grandmother    Social History   Tobacco Use  . Smoking status: Former Smoker    Packs/day: 0.50    Types: Cigarettes    Last attempt to quit: 10/31/2017    Years since quitting: 0.3  . Smokeless tobacco: Never Used  Substance Use Topics  . Alcohol use: Yes    Comment: wine occassioanlly  . Drug use: No   No Known Allergies Medications Prior to Admission  Medication Sig Dispense Refill Last Dose  . cetirizine (ZYRTEC) 10 MG tablet Take 10 mg by mouth daily.   01/08/2018 at Unknown time  . ferrous sulfate  325 (65 FE) MG EC tablet Take 325 mg by mouth 3 (three) times daily with meals.   01/09/2018 at Unknown time  . Multiple Vitamin (MULTIVITAMIN WITH MINERALS) TABS tablet Take 1 tablet by mouth daily.   01/09/2018 at Unknown time    I have reviewed patient's Past Medical Hx, Surgical Hx, Family Hx, Social Hx, medications and allergies.   ROS:  Review of Systems  Constitutional: Negative.   Eyes: Negative.   Respiratory: Negative.   Cardiovascular: Negative.   Gastrointestinal: Positive for blood in stool and constipation.  Endocrine: Negative.   Genitourinary: Negative.   Musculoskeletal: Negative.   Allergic/Immunologic: Negative.   Neurological: Negative.   Hematological: Negative.   Psychiatric/Behavioral: Negative.     Physical Exam   Patient Vitals for the past 24 hrs:  BP Temp Temp src Pulse Resp SpO2 Height Weight  02/20/18 0032 112/71 97.6 F (36.4 C) Oral 97 16 100 % 5\' 6"  (1.676 m) 117 kg (258 lb)   Constitutional: Well-developed, well-nourished female in no acute distress.  Cardiovascular: normal rate Respiratory: normal effort GI: Abd soft, non-tender, gravid appropriate for gestational age. Pos BS x 4 MS: Extremities nontender, no edema, normal ROM Neurologic: Alert and oriented x 4. FHT 154 GU: Neg CVAT.  Pelvic: NEFG, physiologic discharge, no blood, cervix clean. No CMT  Small hemorroid noted       Labs: Results for orders placed or performed during the hospital encounter of 02/20/18 (from the past 24 hour(s))  Urinalysis, Routine w reflex microscopic     Status: Abnormal   Collection Time: 02/20/18 12:41 AM  Result Value Ref Range   Color, Urine STRAW (A) YELLOW   APPearance HAZY (A) CLEAR   Specific Gravity, Urine 1.004 (L) 1.005 - 1.030   pH 7.0 5.0 - 8.0   Glucose, UA NEGATIVE NEGATIVE mg/dL   Hgb urine dipstick NEGATIVE NEGATIVE   Bilirubin Urine NEGATIVE NEGATIVE   Ketones, ur NEGATIVE NEGATIVE mg/dL   Protein, ur NEGATIVE NEGATIVE mg/dL    Nitrite NEGATIVE NEGATIVE   Leukocytes, UA TRACE (A) NEGATIVE   RBC / HPF 0-5 0 - 5 RBC/hpf   WBC, UA 0-5 0 - 5 WBC/hpf   Bacteria, UA RARE (A) NONE SEEN   Squamous Epithelial / LPF 6-30 (A) NONE SEEN    Imaging:  No results found.  MAU Course: Orders Placed This Encounter  Procedures  . Urinalysis, Routine w reflex microscopic   No orders of the defined types were placed in this encounter. wet mount neg trich, hyphae and clue  MDM: PE, UA wet mount Assessment: Rectal bleeding due to constipation  Plan: Increase fiber in diet, metamucil or Citracal.  Discussed high fiber diet.  64 oz of water daily. Discharge home in stable condition.       Kenney Housemanrothero, Elsa Ploch Jean, CNM 02/20/2018 1:32 AM2

## 2018-02-20 NOTE — Discharge Instructions (Signed)
Constipation, Adult Constipation is when a person:  Poops (has a bowel movement) fewer times in a week than normal.  Has a hard time pooping.  Has poop that is dry, hard, or bigger than normal.  Follow these instructions at home: Eating and drinking   Eat foods that have a lot of fiber, such as: ? Fresh fruits and vegetables. ? Whole grains. ? Beans.  Eat less of foods that are high in fat, low in fiber, or overly processed, such as: ? [place] fries. ? Hamburgers. ? Cookies. ? Candy. ? Soda.  Drink enough fluid to keep your pee (urine) clear or pale yellow. General instructions  Exercise regularly or as told by your doctor.  Go to the restroom when you feel like you need to poop. Do not hold it in.  Take over-the-counter and prescription medicines only as told by your doctor. These include any fiber supplements.  Do pelvic floor retraining exercises, such as: ? Doing deep breathing while relaxing your lower belly (abdomen). ? Relaxing your pelvic floor while pooping.  Watch your condition for any changes.  Keep all follow-up visits as told by your doctor. This is important. Contact a doctor if:  You have pain that gets worse.  You have a fever.  You have not pooped for 4 days.  You throw up (vomit).  You are not hungry.  You lose weight.  You are bleeding from the anus.  You have thin, pencil-like poop (stool). Get help right away if:  You have a fever, and your symptoms suddenly get worse.  You leak poop or have blood in your poop.  Your belly feels hard or bigger than normal (is bloated).  You have very bad belly pain.  You feel dizzy or you faint. This information is not intended to replace advice given to you by your health care provider. Make sure you discuss any questions you have with your health care provider. Document Released: 06/04/2008 Document Revised: 07/06/2016 Document Reviewed: 06/06/2016 Elsevier Interactive Patient Education   2018 Elsevier Inc.   Abdominal Pain During Pregnancy Abdominal pain is common in pregnancy. Most of the time, it does not cause harm. There are many causes of abdominal pain. Some causes are more serious than others and sometimes the cause is not known. Abdominal pain can be a sign that something is very wrong with the pregnancy or the pain may have nothing to do with the pregnancy. Always tell your health care provider if you have any abdominal pain. Follow these instructions at home:  Do not have sex or put anything in your vagina until your symptoms go away completely.  Watch your abdominal pain for any changes.  Get plenty of rest until your pain improves.  Drink enough fluid to keep your urine clear or pale yellow.  Take over-the-counter or prescription medicines only as told by your health care provider.  Keep all follow-up visits as told by your health care provider. This is important. Contact a health care provider if:  You have a fever.  Your pain gets worse or you have cramping.  Your pain continues after resting. Get help right away if:  You are bleeding, leaking fluid, or passing tissue from the vagina.  You have vomiting or diarrhea that does not go away.  You have painful or bloody urination.  You notice a decrease in your baby's movements.  You feel very weak or faint.  You have shortness of breath.  You develop a severe headache with  abdominal pain.  You have abnormal vaginal discharge with abdominal pain. This information is not intended to replace advice given to you by your health care provider. Make sure you discuss any questions you have with your health care provider. Document Released: 12/17/2005 Document Revised: 09/27/2016 Document Reviewed: 07/16/2013 Elsevier Interactive Patient Education  [employer]2018 Elsevier Inc.

## 2018-03-13 ENCOUNTER — Encounter (HOSPITAL_COMMUNITY): Payer: Self-pay | Admitting: *Deleted

## 2018-03-13 ENCOUNTER — Inpatient Hospital Stay (HOSPITAL_COMMUNITY)
Admission: AD | Admit: 2018-03-13 | Discharge: 2018-03-17 | DRG: 768 | Disposition: A | Discharge status: Home / Self Care | Payer: BLUE CROSS/BLUE SHIELD | Source: Ambulatory Visit | Attending: Obstetrics and Gynecology | Admitting: Obstetrics and Gynecology

## 2018-03-13 DIAGNOSIS — A419 Sepsis, unspecified organism: Secondary | ICD-10-CM

## 2018-03-13 DIAGNOSIS — O3432 Maternal care for cervical incompetence, second trimester: Principal | ICD-10-CM

## 2018-03-13 DIAGNOSIS — O3430 Maternal care for cervical incompetence, unspecified trimester: Secondary | ICD-10-CM

## 2018-03-13 DIAGNOSIS — B9689 Other specified bacterial agents as the cause of diseases classified elsewhere: Secondary | ICD-10-CM | POA: Diagnosis present

## 2018-03-13 DIAGNOSIS — O411220 Chorioamnionitis, second trimester, not applicable or unspecified: Secondary | ICD-10-CM | POA: Diagnosis present

## 2018-03-13 DIAGNOSIS — O4290 Premature rupture of membranes, unspecified as to length of time between rupture and onset of labor, unspecified weeks of gestation: Secondary | ICD-10-CM | POA: Diagnosis present

## 2018-03-13 DIAGNOSIS — Z3A21 21 weeks gestation of pregnancy: Secondary | ICD-10-CM

## 2018-03-13 DIAGNOSIS — O42912 Preterm premature rupture of membranes, unspecified as to length of time between rupture and onset of labor, second trimester: Secondary | ICD-10-CM | POA: Diagnosis present

## 2018-03-13 DIAGNOSIS — Z87891 Personal history of nicotine dependence: Secondary | ICD-10-CM

## 2018-03-13 LAB — CBC WITH DIFFERENTIAL/PLATELET
Basophils Absolute: 0 10*3/uL (ref 0.0–0.1)
Basophils Relative: 0 %
EOS PCT: 1 %
Eosinophils Absolute: 0.1 10*3/uL (ref 0.0–0.7)
HEMATOCRIT: 32.1 % — AB (ref 36.0–46.0)
HEMOGLOBIN: 10.8 g/dL — AB (ref 12.0–15.0)
LYMPHS ABS: 2.2 10*3/uL (ref 0.7–4.0)
LYMPHS PCT: 20 %
MCH: 29.2 pg (ref 26.0–34.0)
MCHC: 33.6 g/dL (ref 30.0–36.0)
MCV: 86.8 fL (ref 78.0–100.0)
Monocytes Absolute: 0.9 10*3/uL (ref 0.1–1.0)
Monocytes Relative: 8 %
NEUTROS PCT: 71 %
Neutro Abs: 7.7 10*3/uL (ref 1.7–7.7)
Platelets: 309 10*3/uL (ref 150–400)
RBC: 3.7 MIL/uL — AB (ref 3.87–5.11)
RDW: 14.6 % (ref 11.5–15.5)
WBC: 10.8 10*3/uL — AB (ref 4.0–10.5)

## 2018-03-13 LAB — URINALYSIS, ROUTINE W REFLEX MICROSCOPIC
Bilirubin Urine: NEGATIVE
GLUCOSE, UA: NEGATIVE mg/dL
Hgb urine dipstick: NEGATIVE
Ketones, ur: NEGATIVE mg/dL
Nitrite: NEGATIVE
PROTEIN: NEGATIVE mg/dL
Specific Gravity, Urine: 1.01 (ref 1.005–1.030)
pH: 6 (ref 5.0–8.0)

## 2018-03-13 LAB — WET PREP, GENITAL
Sperm: NONE SEEN
Trich, Wet Prep: NONE SEEN
YEAST WET PREP: NONE SEEN

## 2018-03-13 LAB — AMNISURE RUPTURE OF MEMBRANE (ROM) NOT AT ARMC: Amnisure ROM: NEGATIVE

## 2018-03-13 LAB — GROUP B STREP BY PCR: Group B strep by PCR: NEGATIVE

## 2018-03-13 MED ORDER — LACTATED RINGERS IV BOLUS (SEPSIS)
500.0000 mL | Freq: Once | INTRAVENOUS | Status: AC
  Administered 2018-03-13: 500 mL via INTRAVENOUS

## 2018-03-13 MED ORDER — CEFAZOLIN SODIUM-DEXTROSE 1-4 GM/50ML-% IV SOLN
1.0000 g | Freq: Three times a day (TID) | INTRAVENOUS | Status: DC
  Administered 2018-03-14 (×2): 1 g via INTRAVENOUS
  Filled 2018-03-13 (×2): qty 50

## 2018-03-13 MED ORDER — PROGESTERONE MICRONIZED 200 MG PO CAPS
200.0000 mg | ORAL_CAPSULE | Freq: Every day | ORAL | Status: DC
  Administered 2018-03-14: 200 mg via VAGINAL
  Filled 2018-03-13: qty 1

## 2018-03-13 MED ORDER — METRONIDAZOLE IN NACL 5-0.79 MG/ML-% IV SOLN
500.0000 mg | Freq: Once | INTRAVENOUS | Status: AC
  Administered 2018-03-14: 500 mg via INTRAVENOUS
  Filled 2018-03-13: qty 100

## 2018-03-13 MED ORDER — LACTATED RINGERS IV SOLN
INTRAVENOUS | Status: DC
  Administered 2018-03-14 – 2018-03-15 (×4): via INTRAVENOUS

## 2018-03-13 NOTE — MAU Note (Signed)
Clear/white discharge since yesterday.  Underwear now soaked, feeling it leak out all day today.  Not wearing a pad.  No VB.  Also feeling pressure.

## 2018-03-13 NOTE — H&P (Signed)
Bonnie Carr is a 37 y.o. female, G4P0030 at 21.3 weeks, presenting for leaking of fluid.  Pt denies urinary symptoms.  FM positive.  Prenatal hx remarkable for 2 EAB and 1 SAB before 112 weeks.  Thrombocytosis plt count of 419. Anatomy scan on 3/7 with CL 3.8  Patient Active Problem List   Diagnosis Date Noted  . NAUSEA AND VOMITING 05/23/2009  . RUQ PAIN 05/17/2009    History of present pregnancy: Patient entered care at 7.2 weeks.   EDC of 07/21/2018 was established by LMP/US   Anatomy scan: 21.2 weeks, with normal findings and an anterior placenta.   Last evaluation:  yesterday  OB History    Gravida Para Term Preterm AB Living   4       3     SAB TAB Ectopic Multiple Live Births   1 2           Past Medical History:  Diagnosis Date  . GERD (gastroesophageal reflux disease)   . Irregular heart beats   . Kidney stone   . Kidney stones   . Pre-diabetes   . Trichomonas    Past Surgical History:  Procedure Laterality Date  . CHOLECYSTECTOMY    . HYSTEROSCOPY W/D&C N/A 08/24/2016   Procedure: DILATATION AND CURETTAGE /HYSTEROSCOPY;  Surgeon: Essie HartWalda Pinn, MD;  Location: [facility];  Service: Gynecology;  Laterality: N/A;  . TONSILLECTOMY     Family History: family history includes Cancer in her maternal grandmother; Diabetes in her father and maternal grandmother; Heart disease in her mother; Hypertension in her mother. Social History:  reports that she quit smoking about 4 months ago. Her smoking use included cigarettes. She smoked 0.50 packs per day. she has never used smokeless tobacco. She reports that she does not drink alcohol or use drugs.   Prenatal Transfer Tool  Maternal Diabetes: No Genetic Screening: Normal Maternal Ultrasounds/Referrals: Normal Fetal Ultrasounds or other Referrals:  None Maternal Substance Abuse:  No Significant Maternal Medications:  None Significant Maternal Lab Results: None  TDAP  Flu   ROS: All 10 systems reviewed and negative except  stated above.  No Known Allergies     Blood pressure 125/74, pulse 94, temperature 98.9 F (37.2 C), resp. rate 19, height 5\' 7"  (1.702 m), weight 116.7 kg (257 lb 4 oz), last menstrual period 10/14/2017, SpO2 100 %, unknown if currently breastfeeding.Marland Kitchen.  EXAM HEENT wnl Neck supple no thyroidmeagly Heart RRR Lungs clear, resp reg Abd soft gravid BS present  FHT 150 GU BUS wnl Vaginal thin watery mucoid discharge noted cervix open with bulging membranes to 3 cm Bimanual deferred  UCs:   Prenatal labs: ABO, Rh:  O pos Antibody:  Neg Rubella:   Imm RPR:   NR HBsAg:   Neg HIV:   NR GBS:   Sickle cell/Hgb electrophoresis:  AA Pap:  2018 wnl GC:  Neg Chlamydia:  Neg Genetic screenings: Neg Hgb 11.2 at NOB,     Results for orders placed or performed during the hospital encounter of 03/13/18 (from the past 24 hour(s))  Urinalysis, Routine w reflex microscopic     Status: Abnormal   Collection Time: 03/13/18  8:22 PM  Result Value Ref Range   Color, Urine YELLOW YELLOW   APPearance CLEAR CLEAR   Specific Gravity, Urine 1.010 1.005 - 1.030   pH 6.0 5.0 - 8.0   Glucose, UA NEGATIVE NEGATIVE mg/dL   Hgb urine dipstick NEGATIVE NEGATIVE   Bilirubin Urine NEGATIVE NEGATIVE  Ketones, ur NEGATIVE NEGATIVE mg/dL   Protein, ur NEGATIVE NEGATIVE mg/dL   Nitrite NEGATIVE NEGATIVE   Leukocytes, UA SMALL (A) NEGATIVE   RBC / HPF 0-5 0 - 5 RBC/hpf   WBC, UA 6-30 0 - 5 WBC/hpf   Bacteria, UA RARE (A) NONE SEEN   Squamous Epithelial / LPF 0-5 (A) NONE SEEN   Mucus PRESENT   Wet prep, genital     Status: Abnormal   Collection Time: 03/13/18 10:17 PM  Result Value Ref Range   Yeast Wet Prep HPF POC NONE SEEN NONE SEEN   Trich, Wet Prep NONE SEEN NONE SEEN   Clue Cells Wet Prep HPF POC PRESENT (A) NONE SEEN   WBC, Wet Prep HPF POC MANY (A) NONE SEEN   Sperm NONE SEEN   CBC with Differential/Platelet     Status: Abnormal   Collection Time: 03/13/18 10:34 PM  Result Value Ref  Range   WBC 10.8 (H) 4.0 - 10.5 K/uL   RBC 3.70 (L) 3.87 - 5.11 MIL/uL   Hemoglobin 10.8 (L) 12.0 - 15.0 g/dL   HCT 16.1 (L) 09.6 - 04.5 %   MCV 86.8 78.0 - 100.0 fL   MCH 29.2 26.0 - 34.0 pg   MCHC 33.6 30.0 - 36.0 g/dL   RDW 40.9 81.1 - 91.4 %   Platelets 309 150 - 400 K/uL   Neutrophils Relative % 71 %   Neutro Abs 7.7 1.7 - 7.7 K/uL   Lymphocytes Relative 20 %   Lymphs Abs 2.2 0.7 - 4.0 K/uL   Monocytes Relative 8 %   Monocytes Absolute 0.9 0.1 - 1.0 K/uL   Eosinophils Relative 1 %   Eosinophils Absolute 0.1 0.0 - 0.7 K/uL   Basophils Relative 0 %   Basophils Absolute 0.0 0.0 - 0.1 K/uL  Amnisure rupture of membrane (rom)not at [facility]     Status: None   Collection Time: 03/13/18 10:37 PM  Result Value Ref Range   Amnisure ROM NEGATIVE       Assessment/Plan: IUP at 21.3 with cervical incompetence vs preterm labor BV   Plan: Admit to Antenatal unit for observation Dr. Mora Appl aware of pt and poc IV Flagyl than ancef Prometrium 200mg  vaginally  MFM consult in am NPO Trendelenburg and bedrest  Henderson Newcomer ProtheroCNM, MSN 03/13/2018, 11:16 PM

## 2018-03-14 ENCOUNTER — Inpatient Hospital Stay (HOSPITAL_COMMUNITY): Payer: BLUE CROSS/BLUE SHIELD | Admitting: Anesthesiology

## 2018-03-14 ENCOUNTER — Ambulatory Visit (HOSPITAL_COMMUNITY): Admit: 2018-03-14 | Payer: BLUE CROSS/BLUE SHIELD

## 2018-03-14 ENCOUNTER — Other Ambulatory Visit: Payer: Self-pay

## 2018-03-14 ENCOUNTER — Inpatient Hospital Stay (HOSPITAL_COMMUNITY): Payer: BLUE CROSS/BLUE SHIELD

## 2018-03-14 ENCOUNTER — Encounter (HOSPITAL_COMMUNITY): Payer: Self-pay | Admitting: *Deleted

## 2018-03-14 ENCOUNTER — Encounter (HOSPITAL_COMMUNITY)
Admission: AD | Disposition: A | Discharge status: Home / Self Care | Payer: Self-pay | Source: Ambulatory Visit | Attending: Obstetrics & Gynecology

## 2018-03-14 DIAGNOSIS — O3432 Maternal care for cervical incompetence, second trimester: Secondary | ICD-10-CM

## 2018-03-14 HISTORY — PX: CERVICAL CERCLAGE: SHX1329

## 2018-03-14 LAB — GC/CHLAMYDIA PROBE AMP (~~LOC~~) NOT AT ARMC
Chlamydia: NEGATIVE
NEISSERIA GONORRHEA: NEGATIVE

## 2018-03-14 SURGERY — CERCLAGE, CERVIX, VAGINAL APPROACH
Anesthesia: Spinal | Site: Vagina

## 2018-03-14 MED ORDER — DOCUSATE SODIUM 100 MG PO CAPS
100.0000 mg | ORAL_CAPSULE | Freq: Two times a day (BID) | ORAL | Status: DC
  Administered 2018-03-14 – 2018-03-15 (×2): 100 mg via ORAL
  Filled 2018-03-14 (×2): qty 1

## 2018-03-14 MED ORDER — FAMOTIDINE IN NACL 20-0.9 MG/50ML-% IV SOLN
20.0000 mg | Freq: Every day | INTRAVENOUS | Status: DC | PRN
  Administered 2018-03-14: 20 mg via INTRAVENOUS
  Filled 2018-03-14 (×2): qty 50

## 2018-03-14 MED ORDER — SODIUM CHLORIDE 0.9 % IJ SOLN
Freq: Once | INTRAMUSCULAR | Status: AC
  Administered 2018-03-14: 30 mL via VAGINAL
  Filled 2018-03-14: qty 1

## 2018-03-14 MED ORDER — MEPERIDINE HCL 25 MG/ML IJ SOLN
INTRAMUSCULAR | Status: AC
  Administered 2018-03-14: 12.5 mg via INTRAVENOUS
  Filled 2018-03-14: qty 1

## 2018-03-14 MED ORDER — LACTATED RINGERS IV SOLN
Freq: Once | INTRAVENOUS | Status: AC
  Administered 2018-03-14: 19:00:00 via INTRAVENOUS

## 2018-03-14 MED ORDER — FENTANYL CITRATE (PF) 100 MCG/2ML IJ SOLN: 25.0000 ug | INTRAMUSCULAR | Status: DC | PRN

## 2018-03-14 MED ORDER — PHENYLEPHRINE HCL 10 MG/ML IJ SOLN
INTRAMUSCULAR | Status: DC | PRN
  Administered 2018-03-14: 40 ug via INTRAVENOUS
  Administered 2018-03-14 (×2): 80 ug via INTRAVENOUS
  Administered 2018-03-14: 40 ug via INTRAVENOUS
  Administered 2018-03-14: 80 ug via INTRAVENOUS
  Administered 2018-03-14: 120 ug via INTRAVENOUS

## 2018-03-14 MED ORDER — ONDANSETRON HCL 4 MG/2ML IJ SOLN: 4.0000 mg | Freq: Four times a day (QID) | INTRAMUSCULAR | Status: DC | PRN

## 2018-03-14 MED ORDER — PHENYLEPHRINE 40 MCG/ML (10ML) SYRINGE FOR IV PUSH (FOR BLOOD PRESSURE SUPPORT)
PREFILLED_SYRINGE | INTRAVENOUS | Status: AC
  Filled 2018-03-14: qty 10

## 2018-03-14 MED ORDER — MEPERIDINE HCL 25 MG/ML IJ SOLN
12.5000 mg | Freq: Once | INTRAMUSCULAR | Status: AC
  Administered 2018-03-14: 12.5 mg via INTRAVENOUS

## 2018-03-14 MED ORDER — NIFEDIPINE 10 MG PO CAPS
10.0000 mg | ORAL_CAPSULE | Freq: Four times a day (QID) | ORAL | Status: DC
  Administered 2018-03-14 – 2018-03-15 (×4): 10 mg via ORAL
  Filled 2018-03-14 (×5): qty 1

## 2018-03-14 MED ORDER — METRONIDAZOLE IN NACL 5-0.79 MG/ML-% IV SOLN
500.0000 mg | Freq: Two times a day (BID) | INTRAVENOUS | Status: DC
  Administered 2018-03-14 – 2018-03-15 (×3): 500 mg via INTRAVENOUS
  Filled 2018-03-14 (×3): qty 100

## 2018-03-14 MED ORDER — NIFEDIPINE 10 MG PO CAPS
10.0000 mg | ORAL_CAPSULE | Freq: Once | ORAL | Status: AC
  Administered 2018-03-14: 10 mg via ORAL

## 2018-03-14 MED ORDER — SOD CITRATE-CITRIC ACID 500-334 MG/5ML PO SOLN
ORAL | Status: AC
  Filled 2018-03-14: qty 15

## 2018-03-14 MED ORDER — BUPIVACAINE IN DEXTROSE 0.75-8.25 % IT SOLN
INTRATHECAL | Status: DC | PRN
  Administered 2018-03-14: 1.5 mL via INTRATHECAL

## 2018-03-14 MED ORDER — IBUPROFEN 600 MG PO TABS
600.0000 mg | ORAL_TABLET | ORAL | Status: AC
  Administered 2018-03-14: 600 mg via ORAL
  Filled 2018-03-14: qty 1

## 2018-03-14 MED ORDER — SODIUM CHLORIDE 0.9 % IJ SOLN
Freq: Once | INTRAMUSCULAR | Status: DC
  Filled 2018-03-14 (×2): qty 1

## 2018-03-14 MED ORDER — OXYCODONE HCL 5 MG/5ML PO SOLN: 5.0000 mg | Freq: Once | ORAL | Status: DC | PRN

## 2018-03-14 MED ORDER — INFLUENZA VAC SPLIT QUAD 0.5 ML IM SUSY: 0.5000 mL | PREFILLED_SYRINGE | INTRAMUSCULAR | Status: DC

## 2018-03-14 MED ORDER — OXYCODONE HCL 5 MG PO TABS: 5.0000 mg | ORAL_TABLET | Freq: Once | ORAL | Status: DC | PRN

## 2018-03-14 MED ORDER — SOD CITRATE-CITRIC ACID 500-334 MG/5ML PO SOLN
30.0000 mL | Freq: Once | ORAL | Status: AC
  Administered 2018-03-14: 30 mL via ORAL

## 2018-03-14 SURGICAL SUPPLY — 20 items
CANISTER SUCT 3000ML PPV (MISCELLANEOUS) ×3 IMPLANT
GLOVE BIO SURGEON STRL SZ7.5 (GLOVE) ×3 IMPLANT
GLOVE BIOGEL PI IND STRL 7.0 (GLOVE) ×1 IMPLANT
GLOVE BIOGEL PI IND STRL 7.5 (GLOVE) ×1 IMPLANT
GLOVE BIOGEL PI INDICATOR 7.0 (GLOVE) ×2
GLOVE BIOGEL PI INDICATOR 7.5 (GLOVE) ×2
GOWN STRL REUS W/TWL LRG LVL3 (GOWN DISPOSABLE) ×6 IMPLANT
NS IRRIG 1000ML POUR BTL (IV SOLUTION) ×3 IMPLANT
PACK VAGINAL MINOR WOMEN LF (CUSTOM PROCEDURE TRAY) ×3 IMPLANT
PAD OB MATERNITY 4.3X12.25 (PERSONAL CARE ITEMS) ×3 IMPLANT
PAD PREP 24X48 CUFFED NSTRL (MISCELLANEOUS) ×3 IMPLANT
SUT MERSILENE 5MM BP 1 12 (SUTURE) ×3 IMPLANT
SUT PROLENE 1 CT 1 30 (SUTURE) ×3 IMPLANT
SYR BULB IRRIGATION 50ML (SYRINGE) ×3 IMPLANT
TOWEL OR 17X24 6PK STRL BLUE (TOWEL DISPOSABLE) ×3 IMPLANT
TRAY FOLEY BAG SILVER LF 14FR (CATHETERS) ×3 IMPLANT
TRAY FOLEY CATH SILVER 14FR (SET/KITS/TRAYS/PACK) IMPLANT
TUBING NON-CON 1/4 X 20 CONN (TUBING) ×2 IMPLANT
TUBING NON-CON 1/4 X 20' CONN (TUBING) ×1
YANKAUER SUCT BULB TIP NO VENT (SUCTIONS) ×3 IMPLANT

## 2018-03-14 NOTE — Progress Notes (Signed)
Patient ID: Bonnie Carr, female   DOB: 10-26-1981, 37 y.o.   MRN: [id number] Bonnie Carr is a 37 y.o. G4P0030 at 2796w4d admitted for incompetent cervix  Hospital Day No: 2  Subjective: Denies any contractions or cramping and no VB.  Pt seen by Dr. Archie PattenBunce of MFM this morning and recommendations discussed at length.    Objective: BP 99/63 (BP Location: Left Arm)   Pulse (!) 104   Temp 98.5 F (36.9 C) (Oral)   Resp 18   Ht 5\' 7"  (1.702 m)   Wt 257 lb (116.6 kg)   LMP 10/14/2017   SpO2 100%   BMI 40.25 kg/m  I/O last 3 completed shifts: In: 100 [IV Piggyback:100] Out: 350 [Urine:350] No intake/output data recorded.  Physical Exam:  Gen: alert Chest/Lungs: cta bilaterally  Heart/Pulse: RRR  Abdomen: soft, gravid, nontender Uterine fundus: soft, nontender Skin & Color: warm and dry  Neurological: AOx3, DTRs 2+ EXT: negative Homan's b/l, edema tr  FHT:  150 UC:   none SVE:   Dilation: 3 Exam by:: Dr Osborn CohoAngela Muslima Toppins  Labs: Lab Results  Component Value Date   WBC 10.8 (H) 03/13/2018   HGB 10.8 (L) 03/13/2018   HCT 32.1 (L) 03/13/2018   MCV 86.8 03/13/2018   PLT 309 03/13/2018    Assessment and Plan: has NAUSEA AND VOMITING; RUQ PAIN; and Cervical incompetence affecting management of pregnancy in second trimester, antepartum on their problem list. Lengthy, at least 30min, discussion with patient and husband/FOB.  Many questions answered.  I reiterated the recommendations from MFM and discussed the risks of a cerclage.  They wanted to speak with neonatologist before making decision.  After speaking with neonatologist, they decided to proceed with cerclage and understand there is a risk of rupture and fetal demise.  Consent signed and witnessed.  Will cont flagy for BV and d/c ancef and prometrium per MFM recommendations.  Purcell Nailsngela Y Zyanya Glaza 03/14/2018, 1:25 PM

## 2018-03-14 NOTE — Progress Notes (Signed)
S: incompetent cervix with emergency cerclage O: contractions spaced out and irregular A: No bleeding P: continue routine orders Illene BolusLori Clemmons CNM

## 2018-03-14 NOTE — Consult Note (Signed)
Neonatology Antenatal Consultation: Requested: Pinn Reason: PTL, amniotic leak at 22 weeks.  I have reviewed the medical records including the recent consult note by MFM.  This patient presents with leaking and a bulging bag at 21 weeks and 4 days gestation.    I met with Ms. Axley this afternoon around 12:00.  I explained to them that resuscitation and intensive care would not be warranted beow 23 weeks estimated gestational age.  I also explained that even at 23 weeks the survival is relatively low with a high incidence of significant neurodevelopmental handicap is among survivors.  I explained that the conditions are typically more favorable by 24 weeks, with improvements in both survival and neurodevelopmental outcome.  I explained to the family that we would be happy to discuss the appropriate treatment plan if she is able to maintain her pregnancy past the 23-week gestation interval.  Despina Hidden MD

## 2018-03-14 NOTE — Transfer of Care (Signed)
Immediate Anesthesia Transfer of Care Note  Patient: Bonnie Carr  Procedure(s) Performed: CERCLAGE CERVICAL (N/A Vagina )  Patient Location: PACU  Anesthesia Type:Spinal  Level of Consciousness: awake, alert  and oriented  Airway & Oxygen Therapy: Patient Spontanous Breathing  Post-op Assessment: Report given to RN and Post -op Vital signs reviewed and stable  Post vital signs: Reviewed and stable  Last Vitals:  Vitals:   03/14/18 0818 03/14/18 1245  BP: 105/60 99/63  Pulse: 96 (!) 104  Resp: 18 18  Temp: 36.8 C 36.9 C  SpO2: 99% 100%    Last Pain:  Vitals:   03/14/18 1245  TempSrc: Oral  PainSc:          Complications: No apparent anesthesia complications

## 2018-03-14 NOTE — Anesthesia Procedure Notes (Signed)
Spinal  Patient location during procedure: OR Start time: 03/14/2018 2:38 PM End time: 03/14/2018 2:42 PM Staffing Anesthesiologist: Achille RichHodierne, Nahlia Hellmann, MD Performed: anesthesiologist  Preanesthetic Checklist Completed: patient identified, surgical consent, pre-op evaluation, timeout performed, IV checked, risks and benefits discussed and monitors and equipment checked Spinal Block Patient position: right lateral decubitus Prep: DuraPrep Patient monitoring: cardiac monitor, continuous pulse ox and blood pressure Approach: midline Location: L3-4 Injection technique: single-shot Needle Needle type: Pencan  Needle gauge: 24 G Needle length: 9 cm Assessment Sensory level: T10 Additional Notes Functioning IV was confirmed and monitors were applied. Sterile prep and drape, including hand hygiene and sterile gloves were used. The patient was positioned and the spine was prepped. The skin was anesthetized with lidocaine.  Free flow of clear CSF was obtained prior to injecting local anesthetic into the CSF.  The spinal needle aspirated freely following injection.  The needle was carefully withdrawn.  The patient tolerated the procedure well.

## 2018-03-14 NOTE — Op Note (Addendum)
Preop Diagnosis: 1.22 3/7wks 2.Incompetent Cervix   Postop Diagnosis: 1.22 3/7wks 2.Incompetent Cervix   Procedure: Cervical Cerclage  Anesthesia: Spinal   Attending: Osborn CohoAngela Jahayra Mazo, MD   Assistant: Surgical Tech  Findings: 2-3 cms dilated with membranes bulging just beyond external os  Pathology: N/a  Fluids: 2000 cc  UOP: 50 cc  EBL: 5 cc  Complications: None  Procedure:Then patient was taken to the operating room after the risks, benefits and alternatives discussed with the patient and consent signed and witnessed.  The patient was given a spinal per anesthesia and placed in the dorsal lithotomy position.  The patient was prepped and draped in the usual sterile fashion.  The cervix was dilated to 3cm with membranes extending to the external os.  A cervical cerclage stitch was placed using Mersilene and the knot was tied anteriorly on the cervix.  Clindamycin douche was performed.  Membranes remained intact and post procedure fetal heart rate was 153.  Sponge, lap and needle count was correct and the patient was transferred to the recovery room in good condition.

## 2018-03-14 NOTE — Anesthesia Preprocedure Evaluation (Signed)
Anesthesia Evaluation  Patient identified by MRN, date of birth, ID band Patient awake    Reviewed: Allergy & Precautions, H&P , NPO status , Patient's Chart, lab work & pertinent test results  Airway Mallampati: II   Neck ROM: full    Dental   Pulmonary former smoker,    breath sounds clear to auscultation       Cardiovascular negative cardio ROS   Rhythm:regular Rate:Normal     Neuro/Psych    GI/Hepatic GERD  ,  Endo/Other    Renal/GU      Musculoskeletal   Abdominal   Peds  Hematology   Anesthesia Other Findings   Reproductive/Obstetrics                             Anesthesia Physical Anesthesia Plan  ASA: II  Anesthesia Plan: Spinal   Post-op Pain Management:    Induction: Intravenous  PONV Risk Score and Plan: 2 and Ondansetron and Treatment may vary due to age or medical condition  Airway Management Planned: Simple Face Mask  Additional Equipment:   Intra-op Plan:   Post-operative Plan:   Informed Consent: I have reviewed the patients History and Physical, chart, labs and discussed the procedure including the risks, benefits and alternatives for the proposed anesthesia with the patient or authorized representative who has indicated his/her understanding and acceptance.     Plan Discussed with: CRNA, Anesthesiologist and Surgeon  Anesthesia Plan Comments:         Anesthesia Quick Evaluation

## 2018-03-14 NOTE — Consult Note (Signed)
Maternal Fetal Medicine Consultation  I had the pleasure of seeing your patient Bonnie Carr for Maternal-Fetal Medicine consultation on 03/13/2018. As you know, Bonnie Carr is a 37 y.o. G4P0030 at 6666w4d who presents for consultation regarding advanced cervical dilation.  Rateel presented overnight with concern for leakage of fluid. On exam she was found to be 3cm dilated with bulging membranes. Evaluation was negative for rupture of membranes, amnisure negative. WBC normal at 10.8, wet prep with clue cells, urine culture pending. She was admitted for further observation, placed in Trendelenburg, received a dose of IV flagyl and started on Ancef and vaginal progesterone. She has not yet been reexamined this morning.   This morning Bonnie Carr feels well and denies fever, chills, abdominal pain, contractions or bleeding. Her understanding is a plan for possible cerclage today.  Bonnie Carr's obstetric history is notable for two TABs and one first trimester SAB.  The remainder of Bonnie Carr's medical history is unremarkable. Her past surgical history is significant for tonsillectomy, cholecystectomy, and hysteroscopy. She takes prenatal vitamins, Zytrec and Zantac and is allergic to no medications. Bonnie Carr denies alcohol, tobacco or other drug use. Her family history is noncontributory.  I discussed the implications of previable cervical insufficiency v. preterm labor. At this gestational age, unfortunately there is no chance for neonatal survival should delivery occur in the near future. We discussed the imminent risks of previable or periviable delivery, severe systemic infection, and hemorrhage. We reviewed the possibility of exam-indicated cerclage. I discussed that the available evidence is limited in this regard and that at this advanced cervical dilation with bulging membranes the risks for PPROM are greater than 50%. I conferred with my partner on service, Dr. Ezzard StandingNewman, who would not offer a cerclage in this  setting. However, exam-indicated cerclage is not strictly contraindicated, and if her primary group is comfortable with placing a cerclage this may be an option.   If Bonnie Carr does not undergo cerclage placement, I recommended discontinuing vaginal progesterone as the risks of daily insertion of a vaginal suppository in the setting of exposed membranes outweigh any benefit at this advanced cervical dilation.  Recommendations: 1. Our group would not offer exam-indicated cerclage, but it is not strictly contraindicated. 2. Neonatology consult to discuss gestational age at which the family would desire interventions on behalf of the fetus.  3. Discontinue antibiotics. 4. Discontinue Trendelenburg. 5. I do not recommend bedrest. I do recommend pelvic rest but encourage moderate activity. Bedrest has not been shown to decrease preterm delivery but has been shown to increase the risk of VTE and depression. 6. Preconception consultation v. Consultation in early pregnancy in the future to discuss interventions to decrease the risk of recurrent cervical insufficiency/preterm labor.   Recommendations discussed with primary OB Dr. Su Hiltoberts.   Thank you for the opportunity to be a part of the care of Bonnie Carr. Please contact our office if we can be of further assistance.   I spent approximately 30 minutes with this patient with over 50% of time spent in face-to-face counseling.  Darlyn ReadEmily Conall Vangorder, MD Maternal-Fetal Medicine

## 2018-03-15 ENCOUNTER — Inpatient Hospital Stay (HOSPITAL_COMMUNITY): Payer: BLUE CROSS/BLUE SHIELD

## 2018-03-15 ENCOUNTER — Inpatient Hospital Stay (HOSPITAL_COMMUNITY): Payer: BLUE CROSS/BLUE SHIELD | Admitting: Anesthesiology

## 2018-03-15 DIAGNOSIS — B9689 Other specified bacterial agents as the cause of diseases classified elsewhere: Secondary | ICD-10-CM | POA: Diagnosis present

## 2018-03-15 DIAGNOSIS — O4290 Premature rupture of membranes, unspecified as to length of time between rupture and onset of labor, unspecified weeks of gestation: Secondary | ICD-10-CM | POA: Diagnosis present

## 2018-03-15 DIAGNOSIS — O42912 Preterm premature rupture of membranes, unspecified as to length of time between rupture and onset of labor, second trimester: Secondary | ICD-10-CM | POA: Diagnosis present

## 2018-03-15 DIAGNOSIS — O3432 Maternal care for cervical incompetence, second trimester: Secondary | ICD-10-CM | POA: Diagnosis present

## 2018-03-15 DIAGNOSIS — Z87891 Personal history of nicotine dependence: Secondary | ICD-10-CM | POA: Diagnosis not present

## 2018-03-15 DIAGNOSIS — O411220 Chorioamnionitis, second trimester, not applicable or unspecified: Secondary | ICD-10-CM | POA: Diagnosis present

## 2018-03-15 DIAGNOSIS — Z3A21 21 weeks gestation of pregnancy: Secondary | ICD-10-CM | POA: Diagnosis not present

## 2018-03-15 LAB — CBC WITH DIFFERENTIAL/PLATELET
BASOS ABS: 0 10*3/uL (ref 0.0–0.1)
BASOS PCT: 0 %
EOS PCT: 1 %
Eosinophils Absolute: 0 10*3/uL (ref 0.0–0.7)
HEMATOCRIT: 29.1 % — AB (ref 36.0–46.0)
HEMOGLOBIN: 9.7 g/dL — AB (ref 12.0–15.0)
Lymphocytes Relative: 19 %
Lymphs Abs: 0.4 10*3/uL — ABNORMAL LOW (ref 0.7–4.0)
MCH: 29 pg (ref 26.0–34.0)
MCHC: 33.3 g/dL (ref 30.0–36.0)
MCV: 86.9 fL (ref 78.0–100.0)
MONOS PCT: 1 %
Monocytes Absolute: 0 10*3/uL — ABNORMAL LOW (ref 0.1–1.0)
NEUTROS PCT: 79 %
Neutro Abs: 1.6 10*3/uL — ABNORMAL LOW (ref 1.7–7.7)
Other: 0 %
Platelets: 187 10*3/uL (ref 150–400)
RBC: 3.35 MIL/uL — AB (ref 3.87–5.11)
RDW: 14.6 % (ref 11.5–15.5)
WBC: 2 10*3/uL — AB (ref 4.0–10.5)

## 2018-03-15 LAB — TYPE AND SCREEN
ABO/RH(D): O POS
Antibody Screen: NEGATIVE

## 2018-03-15 LAB — COMPREHENSIVE METABOLIC PANEL
ALBUMIN: 2.4 g/dL — AB (ref 3.5–5.0)
ALBUMIN: 2.5 g/dL — AB (ref 3.5–5.0)
ALK PHOS: 115 U/L (ref 38–126)
ALT: 13 U/L — AB (ref 14–54)
ALT: 15 U/L (ref 14–54)
AST: 14 U/L — AB (ref 15–41)
AST: 23 U/L (ref 15–41)
Alkaline Phosphatase: 78 U/L (ref 38–126)
Anion gap: 9 (ref 5–15)
Anion gap: 12 (ref 5–15)
BILIRUBIN TOTAL: 0.4 mg/dL (ref 0.3–1.2)
BILIRUBIN TOTAL: 0.7 mg/dL (ref 0.3–1.2)
BUN: 6 mg/dL (ref 6–20)
BUN: 6 mg/dL (ref 6–20)
CO2: 19 mmol/L — ABNORMAL LOW (ref 22–32)
CO2: 17 mmol/L — ABNORMAL LOW (ref 22–32)
CREATININE: 0.68 mg/dL (ref 0.44–1.00)
Calcium: 8.2 mg/dL — ABNORMAL LOW (ref 8.9–10.3)
Calcium: 8.4 mg/dL — ABNORMAL LOW (ref 8.9–10.3)
Chloride: 108 mmol/L (ref 101–111)
Chloride: 106 mmol/L (ref 101–111)
Creatinine, Ser: 0.8 mg/dL (ref 0.44–1.00)
GFR calc Af Amer: >60 mL/min (ref >60)
GFR calc Af Amer: >60 mL/min (ref >60)
GLUCOSE: 79 mg/dL (ref 65–99)
GLUCOSE: 84 mg/dL (ref 65–99)
POTASSIUM: 3.5 mmol/L (ref 3.5–5.1)
POTASSIUM: 3.1 mmol/L — AB (ref 3.5–5.1)
Sodium: 136 mmol/L (ref 135–145)
Sodium: 135 mmol/L (ref 135–145)
TOTAL PROTEIN: 5.5 g/dL — AB (ref 6.5–8.1)
TOTAL PROTEIN: 5.6 g/dL — AB (ref 6.5–8.1)

## 2018-03-15 LAB — LACTIC ACID, PLASMA
Lactic Acid, Venous: 2.6 mmol/L (ref 0.5–1.9)
Lactic Acid, Venous: 2.9 mmol/L (ref 0.5–1.9)

## 2018-03-15 LAB — PROTIME-INR
INR: 1.12
PROTHROMBIN TIME: 14.3 s (ref 11.4–15.2)

## 2018-03-15 LAB — APTT: aPTT: 31 seconds (ref 24–36)

## 2018-03-15 MED ORDER — IBUPROFEN 600 MG PO TABS
600.0000 mg | ORAL_TABLET | Freq: Four times a day (QID) | ORAL | Status: DC | PRN
  Administered 2018-03-15: 600 mg via ORAL
  Filled 2018-03-15: qty 1

## 2018-03-15 MED ORDER — LIDOCAINE HCL (PF) 1 % IJ SOLN
INTRAMUSCULAR | Status: DC | PRN
  Administered 2018-03-15: 5 mL via EPIDURAL

## 2018-03-15 MED ORDER — PHENYLEPHRINE 40 MCG/ML (10ML) SYRINGE FOR IV PUSH (FOR BLOOD PRESSURE SUPPORT): 80.0000 ug | PREFILLED_SYRINGE | INTRAVENOUS | Status: DC | PRN

## 2018-03-15 MED ORDER — LACTATED RINGERS IV BOLUS (SEPSIS)
300.0000 mL | Freq: Once | INTRAVENOUS | Status: AC
  Administered 2018-03-15: 300 mL via INTRAVENOUS

## 2018-03-15 MED ORDER — OXYCODONE-ACETAMINOPHEN 5-325 MG PO TABS: 2.0000 | ORAL_TABLET | ORAL | Status: DC | PRN

## 2018-03-15 MED ORDER — ONDANSETRON HCL 4 MG/2ML IJ SOLN
4.0000 mg | Freq: Four times a day (QID) | INTRAMUSCULAR | Status: DC | PRN
  Administered 2018-03-15: 4 mg via INTRAVENOUS
  Filled 2018-03-15: qty 2

## 2018-03-15 MED ORDER — LACTATED RINGERS IV SOLN
INTRAVENOUS | Status: DC
  Administered 2018-03-16: 01:00:00 via INTRAVENOUS

## 2018-03-15 MED ORDER — EPHEDRINE 5 MG/ML INJ: 10.0000 mg | INTRAVENOUS | Status: DC | PRN

## 2018-03-15 MED ORDER — SOD CITRATE-CITRIC ACID 500-334 MG/5ML PO SOLN: 30.0000 mL | ORAL | Status: DC | PRN

## 2018-03-15 MED ORDER — EPHEDRINE 5 MG/ML INJ
10.0000 mg | INTRAVENOUS | Status: DC | PRN
  Filled 2018-03-15: qty 2

## 2018-03-15 MED ORDER — OXYTOCIN 40 UNITS IN LACTATED RINGERS INFUSION - SIMPLE MED
1.0000 m[IU]/min | INTRAVENOUS | Status: DC
  Administered 2018-03-15: 2 m[IU]/min via INTRAVENOUS
  Filled 2018-03-15: qty 1000

## 2018-03-15 MED ORDER — ACETAMINOPHEN 500 MG PO TABS
1000.0000 mg | ORAL_TABLET | Freq: Three times a day (TID) | ORAL | Status: DC | PRN
  Administered 2018-03-15: 1000 mg via ORAL
  Filled 2018-03-15: qty 2

## 2018-03-15 MED ORDER — PHENYLEPHRINE 40 MCG/ML (10ML) SYRINGE FOR IV PUSH (FOR BLOOD PRESSURE SUPPORT)
80.0000 ug | PREFILLED_SYRINGE | INTRAVENOUS | Status: DC | PRN
  Administered 2018-03-15 (×2): 80 ug via INTRAVENOUS
  Filled 2018-03-15: qty 10
  Filled 2018-03-15: qty 5

## 2018-03-15 MED ORDER — LACTATED RINGERS IV SOLN
500.0000 mL | Freq: Once | INTRAVENOUS | Status: AC
  Administered 2018-03-15: 500 mL via INTRAVENOUS

## 2018-03-15 MED ORDER — LIDOCAINE HCL (PF) 1 % IJ SOLN
30.0000 mL | INTRAMUSCULAR | Status: DC | PRN
  Filled 2018-03-15: qty 30

## 2018-03-15 MED ORDER — OXYTOCIN BOLUS FROM INFUSION
500.0000 mL | Freq: Once | INTRAVENOUS | Status: AC
  Administered 2018-03-16: 500 mL via INTRAVENOUS

## 2018-03-15 MED ORDER — LACTATED RINGERS IV SOLN: 500.0000 mL | Freq: Once | INTRAVENOUS | Status: DC

## 2018-03-15 MED ORDER — OXYCODONE-ACETAMINOPHEN 5-325 MG PO TABS: 1.0000 | ORAL_TABLET | ORAL | Status: DC | PRN

## 2018-03-15 MED ORDER — DIPHENHYDRAMINE HCL 50 MG/ML IJ SOLN: 12.5000 mg | INTRAMUSCULAR | Status: DC | PRN

## 2018-03-15 MED ORDER — ACETAMINOPHEN 325 MG PO TABS
650.0000 mg | ORAL_TABLET | ORAL | Status: DC | PRN
  Administered 2018-03-15: 650 mg via ORAL
  Filled 2018-03-15: qty 2

## 2018-03-15 MED ORDER — SODIUM CHLORIDE 0.9 % IV BOLUS (SEPSIS)
1000.0000 mL | Freq: Once | INTRAVENOUS | Status: AC
  Administered 2018-03-16: 1000 mL via INTRAVENOUS

## 2018-03-15 MED ORDER — METRONIDAZOLE 500 MG PO TABS
500.0000 mg | ORAL_TABLET | Freq: Two times a day (BID) | ORAL | Status: DC
  Administered 2018-03-15: 500 mg via ORAL
  Filled 2018-03-15 (×2): qty 1

## 2018-03-15 MED ORDER — SODIUM CHLORIDE 0.9 % IV BOLUS (SEPSIS): 1000.0000 mL | Freq: Once | INTRAVENOUS | Status: DC

## 2018-03-15 MED ORDER — SODIUM CHLORIDE 0.9 % IV SOLN
3.0000 g | Freq: Four times a day (QID) | INTRAVENOUS | Status: DC
  Administered 2018-03-15 – 2018-03-16 (×2): 3 g via INTRAVENOUS
  Filled 2018-03-15 (×4): qty 3

## 2018-03-15 MED ORDER — FENTANYL 2.5 MCG/ML BUPIVACAINE 1/10 % EPIDURAL INFUSION (WH - ANES)
14.0000 mL/h | INTRAMUSCULAR | Status: DC | PRN
  Administered 2018-03-15: 14 mL/h via EPIDURAL
  Filled 2018-03-15: qty 100

## 2018-03-15 MED ORDER — TERBUTALINE SULFATE 1 MG/ML IJ SOLN: 0.2500 mg | Freq: Once | INTRAMUSCULAR | Status: DC | PRN

## 2018-03-15 MED ORDER — FLEET ENEMA 7-19 GM/118ML RE ENEM: 1.0000 | ENEMA | RECTAL | Status: DC | PRN

## 2018-03-15 MED ORDER — OXYTOCIN 40 UNITS IN LACTATED RINGERS INFUSION - SIMPLE MED: 2.5000 [IU]/h | INTRAVENOUS | Status: DC

## 2018-03-15 MED ORDER — PHENYLEPHRINE 40 MCG/ML (10ML) SYRINGE FOR IV PUSH (FOR BLOOD PRESSURE SUPPORT)
80.0000 ug | PREFILLED_SYRINGE | INTRAVENOUS | Status: DC | PRN
  Filled 2018-03-15: qty 10
  Filled 2018-03-15: qty 5

## 2018-03-15 MED ORDER — ACETAMINOPHEN 325 MG PO TABS
650.0000 mg | ORAL_TABLET | ORAL | Status: DC | PRN
  Administered 2018-03-16: 650 mg via ORAL
  Filled 2018-03-15: qty 2

## 2018-03-15 MED ORDER — FENTANYL CITRATE (PF) 100 MCG/2ML IJ SOLN
INTRAMUSCULAR | Status: AC
  Administered 2018-03-15: 100 ug via INTRAVENOUS
  Filled 2018-03-15: qty 2

## 2018-03-15 MED ORDER — IBUPROFEN 600 MG PO TABS
600.0000 mg | ORAL_TABLET | Freq: Four times a day (QID) | ORAL | Status: DC
  Administered 2018-03-15: 600 mg via ORAL
  Filled 2018-03-15: qty 1

## 2018-03-15 MED ORDER — FENTANYL CITRATE (PF) 100 MCG/2ML IJ SOLN
100.0000 ug | INTRAMUSCULAR | Status: DC | PRN
  Administered 2018-03-15: 100 ug via INTRAVENOUS

## 2018-03-15 MED ORDER — ONDANSETRON HCL 4 MG/2ML IJ SOLN: 4.0000 mg | Freq: Four times a day (QID) | INTRAMUSCULAR | Status: DC | PRN

## 2018-03-15 MED ORDER — LACTATED RINGERS IV SOLN: INTRAVENOUS | Status: DC

## 2018-03-15 MED ORDER — LACTATED RINGERS IV SOLN
500.0000 mL | INTRAVENOUS | Status: DC | PRN
  Administered 2018-03-15: 500 mL via INTRAVENOUS
  Administered 2018-03-15: 900 mL via INTRAVENOUS

## 2018-03-15 NOTE — Progress Notes (Addendum)
Called to see patient s/p several "gushes" of fluid while on toilet.  Felt like she needed to have a BM, but avoided straining. Just after those gushes, she began to have shaking chills.  Temp 101.5 at 1704 Temp 102.6 at 1733 BP  100/74 at 1733  Vitals:   03/15/18 1210 03/15/18 1704 03/15/18 1710 03/15/18 1730  BP: 103/61   103/77  Pulse:    (!) 126  Resp:    20  Temp:  (!) 101.5 F (38.6 C) 99.4 F (37.4 C) (!) 102.6 F (39.2 C)  TempSrc:  Oral Oral Oral  SpO2:    100%  Weight:      Height:        Assisted back to bed, where chills continued. Vomited x 1. Denied abdominal pain.  Preliminary US results: Vtx, anterior placenta, AFV subjectively WNL Cervix 1.9 cm long, funneling of internal os noted, cerclage in place. FHR 168  Dr. Dion BodyVarnado consulted--recommended sterile speculum exam, fern testing for SROM. CBC, diff, CMP, T&S While moving patient into position for speculum exam, she had large gush of yellowish/green fluid, with obvious ROM, approx 5:45p  Dr[facility]arnado [facility]. Patient reports more cramping now.  Nigel BridgemanVicki Kendle Erker, CNM 03/15/18 6p

## 2018-03-15 NOTE — Anesthesia Postprocedure Evaluation (Signed)
Anesthesia Post Note  Patient: Bonnie Carr  Procedure(s) Performed: CERCLAGE CERVICAL (N/A Vagina )     Patient location during evaluation: Mother Baby Anesthesia Type: Spinal Level of consciousness: awake, awake and alert and oriented Pain management: pain level controlled Vital Signs Assessment: post-procedure vital signs reviewed and stable Respiratory status: spontaneous breathing Cardiovascular status: stable Postop Assessment: no headache, no backache, adequate PO intake, patient able to bend at knees and no apparent nausea or vomiting    Last Vitals:  Vitals:   03/15/18 0603 03/15/18 0820  BP: (!) 103/46 (!) 110/53  Pulse: 89 73  Resp:  20  Temp:  36.9 C  SpO2:  99%    Last Pain:  Vitals:   03/15/18 0820  TempSrc: Oral  PainSc:    Pain Goal: Patients Stated Pain Goal: 0 (03/14/18 2005)               Emmaline KluverBREWER,Makella Buckingham N

## 2018-03-15 NOTE — Anesthesia Preprocedure Evaluation (Signed)
Anesthesia Evaluation  Patient identified by MRN, date of birth, ID band Patient awake    Reviewed: Allergy & Precautions, H&P , NPO status , Patient's Chart, lab work & pertinent test results  Airway Mallampati: II   Neck ROM: full    Dental   Pulmonary former smoker,    breath sounds clear to auscultation       Cardiovascular negative cardio ROS   Rhythm:regular Rate:Normal     Neuro/Psych    GI/Hepatic GERD  ,  Endo/Other    Renal/GU stones     Musculoskeletal   Abdominal   Peds  Hematology  (+) anemia ,   Anesthesia Other Findings   Reproductive/Obstetrics (+) Pregnancy                             Anesthesia Physical Anesthesia Plan  ASA: II  Anesthesia Plan: Epidural   Post-op Pain Management:    Induction: Intravenous  PONV Risk Score and Plan: 2 and Treatment may vary due to age or medical condition  Airway Management Planned: Natural Airway  Additional Equipment:   Intra-op Plan:   Post-operative Plan:   Informed Consent: I have reviewed the patients History and Physical, chart, labs and discussed the procedure including the risks, benefits and alternatives for the proposed anesthesia with the patient or authorized representative who has indicated his/her understanding and acceptance.     Plan Discussed with: Anesthesiologist  Anesthesia Plan Comments:         Anesthesia Quick Evaluation

## 2018-03-15 NOTE — Addendum Note (Signed)
Addendum  created 03/15/18 0855 by Trellis PaganiniBrewer, Tarius Stangelo N, CRNA   Sign clinical note

## 2018-03-15 NOTE — Progress Notes (Signed)
Now on Birthing Suite, continues to leak yellowish fluid. Reports moderate cramping. Husband, sister, and aunt at bedside.  BP 100/34 T 100.8 P 133  Dr. Dion BodyVarnado coming, planning to remove cerclage.  Unasyn 3 gm IV q 6 hours. Blood cultures drawn prior to ATB initiation.  NICU notified of patient status--they would not anticipate resuscitation and intensive care below [redacted] weeks EGA, per consult note from neonatologist.  Nigel BridgemanVicki Janera Peugh, CNM 03/15/18 6:45p

## 2018-03-15 NOTE — Progress Notes (Addendum)
36 y/o G4 P0030 @ 22 4/7 weeks Cervical Insufficiency, s/p Rescue Cerclage POD#1  In to remove cerclage due to PPROM, Chorioamnionitis.  Pt is tearful.  Family at bedside.  BP (!) 98/48   Pulse (!) 126   Temp (!) 102.8 F (39.3 C) (Axillary)   Resp 20   Ht 5\' 7"  (1.702 m)   Wt 116.6 kg (257 lb)   LMP 10/08/2017 (Approximate)   SpO2 98%   BMI 40.25 kg/m    Gen:  NAD, does not appear critically ill Pelvic: Cervix thick, edematous, [place] knot @ 11 o'clock Grossly ruptured, purulent yellow discharge, slight fishy odor 3-4/50/Ballotable  Procedure:  Prior to procedure, discussed diagnosis of chorioamnionitis,  Prognosis, rationale for moving towards delivery.  Pt informed that baby is previable and understands not resuscitation will be performed.   Graves speculum inserted.  Knot Identified and lifted up with forceps, ringed forceps used to push down edematous cervix to visualize the base.  Suture cut but both sides of loop cut.  Graves removed.  Remaining suture palpated.  Inserted speculum but could not visualized suture.   Recommended IV Fentanyl due to pt discomfort.  Once pt was comfortable, I resumed exploration in ULQ of cervix. Cervix was tearing and bleeding some with exploration.  Aborted procedure.  When feeling for cervical dilitation, I could feel tape posteriorly.  With 2 fingers, remainder of the suture was removed.  Cervix was then checked. Minimal bleeding noted.  Pt tolerated well.  Ordered Tylenol 1 gram for fever and an additional 1 liter fluid bolus.  Unasyn already in progress.  Erin SonsVickie Latham, CNM at bedside during discussion and assisted with procedure.

## 2018-03-15 NOTE — Progress Notes (Signed)
[facility]  Awaiting Dr. Dion BodyVarnado.  Will give unasyn once BC obtained.

## 2018-03-15 NOTE — Progress Notes (Addendum)
In to check on patient status. Patient noted small amount of brownish/yellow d/c on pad when up to BR, then noted several more sensations of "wetness".  Small amount yellow d/c on pad, no obvious active leaking. No bleeding noted.  Reports only mild cramping at times, noting good FM. Procardia 10 mg 1210 Ibuprophen 600 mg 1024, with benefit.  Vitals:   03/15/18 0603 03/15/18 0820 03/15/18 1200 03/15/18 1210  BP: (!) 103/46 (!) 110/53 (!) 100/50 103/61  Pulse: 89 73 97   Resp:  20 20   Temp:  98.4 F (36.9 C) 98.9 F (37.2 C)   TempSrc:  Oral Oral   SpO2:  99% 98%   Weight:      Height:       FHR 156 at 0900.  Consulted with Dr. Ceasar MonsVarnado--will check cervical length/cerclage and fluid volume with limited US.  No transvaginal assessment. Ibuprophen 600 mg po q 6 hours ATC x 24 hours, then prn.  Nigel BridgemanVicki Larayah Clute, CNM 03/15/18 3:11p

## 2018-03-15 NOTE — Progress Notes (Signed)
Pt called out for RN, stating she felt a few gushes of fluid. Scant amount of yellowish discharge noted on pad. Pt denies any pain at this time. Midwife at bedside to evaluate. Will continue to monitor.

## 2018-03-15 NOTE — Anesthesia Postprocedure Evaluation (Signed)
Anesthesia Post Note  Patient: Bonnie Carr  Procedure(s) Performed: CERCLAGE CERVICAL (N/A Vagina )     Patient location during evaluation: PACU Anesthesia Type: Spinal Level of consciousness: oriented and awake and alert Pain management: pain level controlled Vital Signs Assessment: post-procedure vital signs reviewed and stable Respiratory status: spontaneous breathing, respiratory function stable and patient connected to nasal cannula oxygen Cardiovascular status: blood pressure returned to baseline and stable Postop Assessment: no headache, no backache and no apparent nausea or vomiting Anesthetic complications: no    Last Vitals:  Vitals:   03/15/18 0553 03/15/18 0603  BP: (!) 85/51 (!) 103/46  Pulse: (!) 104 89  Resp: 19   Temp: 36.8 C   SpO2: 100%     Last Pain:  Vitals:   03/15/18 0603  TempSrc:   PainSc: 2    Pain Goal: Patients Stated Pain Goal: 0 (03/14/18 2005)               Lyndy Russman S

## 2018-03-15 NOTE — Progress Notes (Signed)
Pt called out to RN, stating she felt a large gush of fluid. Pt saturated a small, white pad with yellowish-greenish fluid. Pt started shaking uncontrollably and stated she felt like she needed to have a BM. Temp 101.5. Midwife contacted and is en route to department.

## 2018-03-15 NOTE — Anesthesia Procedure Notes (Signed)
Epidural Patient location during procedure: OB Start time: 03/15/2018 8:29 PM End time: 03/15/2018 8:38 PM  Staffing Anesthesiologist: Achille RichHodierne, Mell Guia, MD Performed: anesthesiologist   Preanesthetic Checklist Completed: patient identified, site marked, pre-op evaluation, timeout performed, IV checked, risks and benefits discussed and monitors and equipment checked  Epidural Patient position: sitting Prep: DuraPrep Patient monitoring: heart rate, cardiac monitor, continuous pulse ox and blood pressure Approach: midline Location: L2-L3 Injection technique: LOR saline  Needle:  Needle type: Tuohy  Needle gauge: 17 G Needle length: 9 cm Needle insertion depth: 9 cm Catheter type: closed end flexible Catheter size: 19 Gauge Catheter at skin depth: 14 cm Test dose: negative and Other  Assessment Events: blood not aspirated, injection not painful, no injection resistance and negative IV test  Additional Notes Informed consent obtained prior to proceeding including risk of failure, 1% risk of PDPH, risk of minor discomfort and bruising.  Discussed rare but serious complications including epidural abscess, permanent nerve injury, epidural hematoma.  Discussed alternatives to epidural analgesia and patient desires to proceed.  Timeout performed pre-procedure verifying patient name, procedure, and platelet count.  Patient tolerated procedure well. Reason for block:procedure for pain

## 2018-03-15 NOTE — Progress Notes (Addendum)
Hospital day # 1 pregnancy at 3933w4d--s/p rescue cerclage.  S:  Sleeping at intervals      Perception of contractions: Occasional mild cramping      Vaginal bleeding: None       Vaginal discharge:  None  O: BP (!) 110/53 (BP Location: Right Arm)   Pulse 73   Temp 98.4 F (36.9 C) (Oral)   Resp 20   Ht 5\' 7"  (1.702 m)   Wt 116.6 kg (257 lb)   LMP 10/08/2017 (Approximate)   SpO2 99%   BMI 40.25 kg/m    Vitals:   03/15/18 0036 03/15/18 0553 03/15/18 0603 03/15/18 0820  BP: (!) 100/55 (!) 85/51 (!) 103/46 (!) 110/53  Pulse: 97 (!) 104 89 73  Resp: 20 19  20   Temp: 97.9 F (36.6 C) 98.3 F (36.8 C)  98.4 F (36.9 C)  TempSrc: Oral Oral  Oral  SpO2: 100% 100%  99%  Weight:      Height:            Fetal tracings:  FHR 160 at 0005      Contractions:   None at present      Uterus non-tender      No bleeding or leaking      Extremities: no significant edema and no signs of DVT, SCDs in place          Labs:  No results found for this or any previous visit (from the past 24 hour(s)).   Urine culture from 3/14 pending.       Meds:  . docusate sodium  100 mg Oral BID  . Influenza vac split quadrivalent PF  0.5 mL Intramuscular Tomorrow-1000  . NIFEdipine  10 mg Oral Q6H   . famotidine (PEPCID) IV 20 mg (03/14/18 0512)  . lactated ringers 125 mL/hr at 03/15/18 0445  . metronidazole Stopped (03/14/18 2256)    A: 3333w4d s/p rescue cerclage     BV     Stable  P: Continue current plan of care--BR with BRP, FHR q shift, SCDs in place, saline lock IV, continue Flagyl for BV treatment, change to PO regimen.      Upcoming tests/treatments:  Procardia 10 mg po q 6 hours, Ibuprophen 600 mg po q 6 hours prn cramping.      Plan for repeat US for cervical length on Monday per report from off-going CNM--order placed.      MDs will follow  Nigel BridgemanVicki Lejon Afzal CNM, [place] 03/15/2018 8:44 AM

## 2018-03-15 NOTE — Progress Notes (Signed)
Pt being transferred to L&D room 161. Fetal HR 178 on doppler.

## 2018-03-16 ENCOUNTER — Encounter (HOSPITAL_COMMUNITY): Payer: Self-pay | Admitting: Obstetrics and Gynecology

## 2018-03-16 LAB — CBC
HCT: 24.3 % — ABNORMAL LOW (ref 36.0–46.0)
HCT: 23.7 % — ABNORMAL LOW (ref 36.0–46.0)
Hemoglobin: 8.3 g/dL — ABNORMAL LOW (ref 12.0–15.0)
Hemoglobin: 8.1 g/dL — ABNORMAL LOW (ref 12.0–15.0)
MCH: 29.6 pg (ref 26.0–34.0)
MCH: 29.7 pg (ref 26.0–34.0)
MCHC: 34.2 g/dL (ref 30.0–36.0)
MCHC: 34.2 g/dL (ref 30.0–36.0)
MCV: 86.8 fL (ref 78.0–100.0)
MCV: 86.8 fL (ref 78.0–100.0)
Platelets: 203 10*3/uL (ref 150–400)
Platelets: 196 10*3/uL (ref 150–400)
RBC: 2.8 MIL/uL — ABNORMAL LOW (ref 3.87–5.11)
RBC: 2.73 MIL/uL — ABNORMAL LOW (ref 3.87–5.11)
RDW: 14.9 % (ref 11.5–15.5)
RDW: 14.9 % (ref 11.5–15.5)
WBC: 21.9 10*3/uL — ABNORMAL HIGH (ref 4.0–10.5)
WBC: 21.3 10*3/uL — ABNORMAL HIGH (ref 4.0–10.5)

## 2018-03-16 LAB — CULTURE, OB URINE: Culture: <10000 — AB

## 2018-03-16 LAB — RPR: RPR Ser Ql: NONREACTIVE

## 2018-03-16 LAB — LACTIC ACID, PLASMA
LACTIC ACID, VENOUS: 1.8 mmol/L (ref 0.5–1.9)
LACTIC ACID, VENOUS: 2.6 mmol/L — AB (ref 0.5–1.9)

## 2018-03-16 LAB — PROCALCITONIN: PROCALCITONIN: 38.36 ng/mL

## 2018-03-16 MED ORDER — IBUPROFEN 600 MG PO TABS
600.0000 mg | ORAL_TABLET | Freq: Four times a day (QID) | ORAL | Status: DC
  Administered 2018-03-16 – 2018-03-17 (×4): 600 mg via ORAL
  Filled 2018-03-16 (×5): qty 1

## 2018-03-16 MED ORDER — METHYLERGONOVINE MALEATE 0.2 MG/ML IJ SOLN: 0.2000 mg | INTRAMUSCULAR | Status: DC | PRN

## 2018-03-16 MED ORDER — OXYCODONE-ACETAMINOPHEN 5-325 MG PO TABS: 1.0000 | ORAL_TABLET | ORAL | Status: DC | PRN

## 2018-03-16 MED ORDER — OXYCODONE HCL 5 MG PO TABS
10.0000 mg | ORAL_TABLET | ORAL | Status: DC | PRN
  Administered 2018-03-16: 10 mg via ORAL
  Filled 2018-03-16: qty 2

## 2018-03-16 MED ORDER — DOCUSATE SODIUM 100 MG PO CAPS
100.0000 mg | ORAL_CAPSULE | Freq: Three times a day (TID) | ORAL | Status: DC
  Administered 2018-03-16 (×2): 100 mg via ORAL
  Filled 2018-03-16 (×3): qty 1

## 2018-03-16 MED ORDER — OXYCODONE HCL 5 MG PO TABS: 5.0000 mg | ORAL_TABLET | ORAL | Status: DC | PRN

## 2018-03-16 MED ORDER — PRENATAL MULTIVITAMIN CH
1.0000 | ORAL_TABLET | Freq: Every day | ORAL | Status: DC
Start: 2018-03-16 — End: 2018-03-17
  Administered 2018-03-16: 1 via ORAL
  Filled 2018-03-16: qty 1

## 2018-03-16 MED ORDER — TETANUS-DIPHTH-ACELL PERTUSSIS 5-2.5-18.5 LF-MCG/0.5 IM SUSP: 0.5000 mL | Freq: Once | INTRAMUSCULAR | Status: DC

## 2018-03-16 MED ORDER — ZOLPIDEM TARTRATE 5 MG PO TABS: 5.0000 mg | ORAL_TABLET | Freq: Every evening | ORAL | Status: DC | PRN

## 2018-03-16 MED ORDER — BENZOCAINE-MENTHOL 20-0.5 % EX AERO: 1.0000 "application " | INHALATION_SPRAY | CUTANEOUS | Status: DC | PRN

## 2018-03-16 MED ORDER — ACETAMINOPHEN 325 MG PO TABS: 650.0000 mg | ORAL_TABLET | ORAL | Status: DC | PRN

## 2018-03-16 MED ORDER — METHYLERGONOVINE MALEATE 0.2 MG PO TABS: 0.2000 mg | ORAL_TABLET | ORAL | Status: DC | PRN

## 2018-03-16 MED ORDER — SODIUM CHLORIDE 0.9 % IV SOLN
3.0000 g | Freq: Four times a day (QID) | INTRAVENOUS | Status: DC
  Administered 2018-03-16 – 2018-03-17 (×5): 3 g via INTRAVENOUS
  Filled 2018-03-16 (×8): qty 3

## 2018-03-16 MED ORDER — WITCH HAZEL-GLYCERIN EX PADS: 1.0000 "application " | MEDICATED_PAD | CUTANEOUS | Status: DC | PRN

## 2018-03-16 MED ORDER — ONDANSETRON HCL 4 MG/2ML IJ SOLN: 4.0000 mg | INTRAMUSCULAR | Status: DC | PRN

## 2018-03-16 MED ORDER — COCONUT OIL OIL: 1.0000 "application " | TOPICAL_OIL | Status: DC | PRN

## 2018-03-16 MED ORDER — SODIUM CHLORIDE 0.9 % IV BOLUS (SEPSIS): 1000.0000 mL | Freq: Once | INTRAVENOUS | Status: DC

## 2018-03-16 MED ORDER — SENNOSIDES-DOCUSATE SODIUM 8.6-50 MG PO TABS
2.0000 | ORAL_TABLET | ORAL | Status: DC
  Administered 2018-03-16: 2 via ORAL
  Filled 2018-03-16: qty 2

## 2018-03-16 MED ORDER — ONDANSETRON HCL 4 MG PO TABS: 4.0000 mg | ORAL_TABLET | ORAL | Status: DC | PRN

## 2018-03-16 MED ORDER — LACTATED RINGERS IV SOLN
INTRAVENOUS | Status: DC
  Administered 2018-03-16 (×2): via INTRAVENOUS

## 2018-03-16 MED ORDER — SIMETHICONE 80 MG PO CHEW: 80.0000 mg | CHEWABLE_TABLET | ORAL | Status: DC | PRN

## 2018-03-16 MED ORDER — DIPHENHYDRAMINE HCL 25 MG PO CAPS: 25.0000 mg | ORAL_CAPSULE | Freq: Four times a day (QID) | ORAL | Status: DC | PRN

## 2018-03-16 MED ORDER — DIBUCAINE 1 % RE OINT: 1.0000 "application " | TOPICAL_OINTMENT | RECTAL | Status: DC | PRN

## 2018-03-16 NOTE — Progress Notes (Signed)
In room to assess pt due to chorioamnionitis, sepsis. Reviewed CNM note. Pt sitting in chair. Pt denies severe abdominal pain or heavy abdominal bleeding.  Has questions about her infection.  BP 112/75 (BP Location: Right Arm)   Pulse 98   Temp 97.9 F (36.6 C) (Oral)   Resp 18   Ht 5\' 7"  (1.702 m)   Wt 116.6 kg (257 lb)   LMP 10/08/2017 (Approximate)   SpO2 99%   BMI 40.25 kg/m   Gen:NAD, A&Ox 3 Abd: No fundal tenderness Ext:  No edema or tenderness  Cultures: Amniotic fluid-no organisms, Mod WBCs Blood x 2- Pending Urine-<10K, no insignificant growth.  A/P  37 y/o G4 P0030 s/p VD of nonviable fetus Sepsis due to Chorioamnionitis. On Unasyn. Doing significantly better. Continue for minimum of 24 hours post delivery or more if blood cultures positive.  Afebrile less than 24 hours.   CCOB can determine whether to continue antibiotics.   Cervical Insufficiency, s/p Rescue Cerclage S/p Grief counseling with clergy.

## 2018-03-16 NOTE — Progress Notes (Signed)
CSW received consult due to IUFD.  CSW available for support secondary to Spiritual Care Services and will await call from Chaplain before becoming involved.  CSW screening out referral at this time.  Gilmar Bua Boyd-Gilyard, MSW, LCSW Clinical Social Work [phone]  

## 2018-03-16 NOTE — Progress Notes (Signed)
Bonnie Carr is a 37 y.o. G4P0030 at 322w5d by  admitted for incompetent cervix with srom  Subjective:  Uncomfortable with uc's Objective: BP (!) 89/59   Pulse (!) 118   Temp 98.5 F (36.9 C) (Oral)   Resp (!) 24   Ht 5\' 7"  (1.702 m)   Wt 257 lb (116.6 kg)   LMP 10/08/2017 (Approximate)   SpO2 98%   BMI 40.25 kg/m  I/O last 3 completed shifts: In: 2600 [I.V.:2600] Out: 1855 [Urine:1850; Blood:5] No intake/output data recorded.   UC:   adequate SVE:   Dilation: 7 Effacement (%): 80 Station: -2 Exam by:: C Fisher RN  Labs: Lab Results  Component Value Date   WBC 2.0 (L) 03/15/2018   HGB 9.7 (L) 03/15/2018   HCT 29.1 (L) 03/15/2018   MCV 86.9 03/15/2018   PLT 187 03/15/2018    Assessment / Plan: Induction of labor due to PROM and nonviable; maternal Sepsis,  progressing well on pitocin  Pain Control:  Epidural I/D:  Sepsis protocol Anticipated MOD:  NSVD  Lori A Clemmons CNM 03/16/2018, 1:46 AM

## 2018-03-16 NOTE — Progress Notes (Signed)
Name: Rennis ChrisLowe Dwight  Location: [id number]  Ashby DawesNature of visit: IUFD (inter-uterine demise) Chaplain visited with mother of body and father of baby after IUFD (inter-uterine demise). Social Work reached out and asked chaplain to check-in on the Pt--When chaplain arrived MOB and FOB were open to the visit. Both tearful and grieving "appropriately." Patient engaged the chaplain in topics of grace, trust and faith. Pt express appreciation for visit. Closed visit with prayer.

## 2018-03-16 NOTE — Progress Notes (Addendum)
Subjective: Postpartum Day 0: Vaginal delivery at 22 5/7 weeks, non viable female, no laceration In process of transfer to 3rd floor for pp care.  Coping well, husband and sister at bedside, very supportive. Declines autopsy.  Stood at bedside, no syncope or dizziness.  Objective: Vital signs in last 24 hours: Temp:  [97.7 F (36.5 C)-102.8 F (39.3 C)] 98.2 F (36.8 C) (03/17 0700) Pulse Rate:  [73-179] 97 (03/17 0700) Resp:  [18-26] 18 (03/17 0700) BP: (79-125)/(34-77) 102/61 (03/17 0700) SpO2:  [89 %-100 %] 98 % (03/17 0700)   Vitals:   03/16/18 0557 03/16/18 0616 03/16/18 0631 03/16/18 0700  BP: (!) 102/58 94/66 104/62 102/61  Pulse: (!) 105 93 (!) 102 97  Resp: 18 18  18   Temp:    98.2 F (36.8 C)  TempSrc:    Oral  SpO2: 98% 99%  98%  Weight:      Height:       Tmax 102.8 at 1844 Afebrile since.  VS at 0700--Temp 98.2, P 97, R 18, BP 102/61  Physical Exam:  General: alert  Chest: Clear Heart: RRR without murmur Lochia: appropriate Uterine Fundus: firm, NT. Perineum: Intact DVT Evaluation: No evidence of DVT seen on physical exam.   CBC Latest Ref Rng & Units 03/16/2018 03/15/2018 03/13/2018  WBC 4.0 - 10.5 K/uL 21.9(H) 2.0(L) 10.8(H)  Hemoglobin 12.0 - 15.0 g/dL 8.3(L) 9.7(L) 10.8(L)  Hematocrit 36.0 - 46.0 % 24.3(L) 29.1(L) 32.1(L)  Platelets 150 - 400 K/uL 203 187 309     Assessment/Plan: Status post vaginal delivery day 0--incompetent cervix, s/p rescue cerclage, PROM, delivery of non-viable female. Stable Continue current care. Support to patient and family for loss. Will consult Dr. Dion BodyVarnado for additional orders (? ATB).    Bonnie BridgemanVicki Deztiny Carr CNM 03/16/2018, 7:10 AM

## 2018-03-16 NOTE — Progress Notes (Signed)
CRITICAL VALUE ALERT  Critical Value:  Lactic acid 2.6  Date & Time Notied:  03/16/18 0800  Provider Notified: Dr. Dion BodyVarnado  Orders Received/Actions taken: orders received for antibiotics

## 2018-03-16 NOTE — Anesthesia Postprocedure Evaluation (Signed)
Anesthesia Post Note  Patient: Bonnie Carr  Procedure(s) Performed: AN AD HOC LABOR EPIDURAL     Patient location during evaluation: Mother Baby Anesthesia Type: Epidural Level of consciousness: awake and alert and oriented Pain management: satisfactory to patient Vital Signs Assessment: post-procedure vital signs reviewed and stable Respiratory status: spontaneous breathing and nonlabored ventilation Cardiovascular status: stable Postop Assessment: no headache, no backache, no signs of nausea or vomiting, adequate PO intake and patient able to bend at knees (patient up walking) Anesthetic complications: no    Last Vitals:  Vitals:   03/16/18 0914 03/16/18 1201  BP: (!) 94/50 112/75  Pulse: (!) 110 98  Resp: 18 18  Temp: 36.8 C 36.6 C  SpO2: 100% 99%    Last Pain:  Vitals:   03/16/18 1201  TempSrc: Oral  PainSc:    Pain Goal: Patients Stated Pain Goal: 2 (03/15/18 1300)               Madison HickmanGREGORY,Hollister Wessler

## 2018-03-17 MED ORDER — ZOLPIDEM TARTRATE 5 MG PO TABS: 5.0000 mg | ORAL_TABLET | Freq: Every evening | ORAL | 0 refills | Status: DC | PRN

## 2018-03-17 MED ORDER — METRONIDAZOLE 500 MG PO TABS: 500.0000 mg | ORAL_TABLET | Freq: Two times a day (BID) | ORAL | 0 refills | Status: AC

## 2018-03-17 MED ORDER — IBUPROFEN 600 MG PO TABS: 600.0000 mg | ORAL_TABLET | Freq: Four times a day (QID) | ORAL | 0 refills | Status: DC

## 2018-03-17 NOTE — Progress Notes (Signed)
Post Partum Day 1 Subjective: no complaints, up ad lib, voiding and tolerating PO. Patient denies Fever or chills.   Objective: Blood pressure 108/63, pulse 71, temperature 98.4 F (36.9 C), temperature source Oral, resp. rate 16, height 5\' 7"  (1.702 m), weight 116.6 kg (257 lb), last menstrual period 10/08/2017, SpO2 97 %, unknown if currently breastfeeding.  Physical Exam:  General: alert, cooperative and tearful Lochia: appropriate Uterine Fundus: firm DVT Evaluation: No evidence of DVT seen on physical exam. Negative Homan's sign. No cords or calf tenderness.  Recent Labs    03/16/18 0419 03/16/18 0831  HGB 8.3* 8.1*  HCT 24.3* 23.7*    Assessment/Plan: Discharge home with metronidazole Heartstrings brochure given to patient Discussed close follow up in office   LOS: 2 days   Bonnie Carr Bonnie Carr 03/17/2018, 9:01 AM

## 2018-03-17 NOTE — Discharge Summary (Signed)
Obstetric Discharge Summary Reason for Admission: Advanced dilation in second trimester Prenatal Procedures: cerclage and ultrasound; removal of cerclage after PPROM Intrapartum Procedures: spontaneous vaginal delivery Postpartum Procedures: antibiotics Complications-Operative and Postpartum: none Hemoglobin  Date Value Ref Range Status  03/16/2018 8.1 (L) 12.0 - 15.0 g/dL Final   HCT  Date Value Ref Range Status  03/16/2018 23.7 (L) 36.0 - 46.0 % Final    Physical Exam:  General: alert, cooperative and tearful Lochia: appropriate Uterine Fundus: firm DVT Evaluation: No evidence of DVT seen on physical exam. Negative Homan's sign. No cords or calf tenderness.  Discharge Diagnoses: Amnionitis, Incompetent cervix and PPROM preterm delivery in second trimester with neonatal demise  Discharge Information: Date: 03/17/2018 Activity: pelvic rest Diet: routine Medications: PNV, Ibuprofen and metronidazole and prn ambien Condition: stable Instructions: refer to practice specific booklet Discharge to: home   Newborn Data: Live born female  Birth Weight: 1 lb 2.3 oz (520 g) APGAR: 2, 2 Deceased Newborn Delivery   Birth date/time:  03/16/2018 02:22:00 Delivery type:  Vaginal, Spontaneous       Maralee Higuchi [name] 03/17/2018, 9:09 AM

## 2018-03-17 NOTE — Progress Notes (Signed)
CRITICAL VALUE ALERT  Critical Value:  Enterococcus in Amniotic Fluid  Date & Time Notied:  03/17/18 1308  Provider Notified: Mora ApplPinn  Orders Received/Actions taken: discharge to home, physician notified.

## 2018-03-17 NOTE — Progress Notes (Signed)
Follow up visit with Toni Amendourtney and Arnetha GulaJaspon after the death of their son, Feliberto GottronJamis Daniel, yesterday.  They have decided to use [facility] [employer] and are thinking they will choose cremation since FrederickJaspon lives in [place].  We discussed follow up options for support and and their resources for coping.  We also discussed complicated grief-Laekyn's mother died about a year ago and Jaspon's birthday was on the 11th and Jianni's birthday is today.  The couple vocalized the places where they've been able to find joy and hope in their experience with their son and their gratitude for the tremendous care they've received from staff.  Couple is using their resources and grieving appropriately as they move through tears and laughter within their grief.  Couple consents to follow up support.  Please page as further needs arise.  Maryanna ShapeAmanda M. Carley Hammedavee Lomax, M.Div. [facility] Chaplain Pager [phone] Office [phone]

## 2018-03-18 LAB — BODY FLUID CULTURE

## 2018-03-21 LAB — CULTURE, BLOOD (ROUTINE X 2)
CULTURE: NO GROWTH
Culture: NO GROWTH
Special Requests: ADEQUATE
Special Requests: ADEQUATE

## 2018-04-30 ENCOUNTER — Ambulatory Visit: Payer: BLUE CROSS/BLUE SHIELD | Admitting: Cardiovascular Disease

## 2018-05-01 ENCOUNTER — Encounter: Payer: Self-pay | Admitting: *Deleted

## 2018-05-07 ENCOUNTER — Telehealth: Payer: Self-pay | Admitting: *Deleted

## 2018-05-07 ENCOUNTER — Encounter: Payer: Self-pay | Admitting: Cardiovascular Disease

## 2018-05-07 ENCOUNTER — Ambulatory Visit (INDEPENDENT_AMBULATORY_CARE_PROVIDER_SITE_OTHER): Payer: BLUE CROSS/BLUE SHIELD | Admitting: Cardiovascular Disease

## 2018-05-07 DIAGNOSIS — R002 Palpitations: Secondary | ICD-10-CM

## 2018-05-07 DIAGNOSIS — G4733 Obstructive sleep apnea (adult) (pediatric): Secondary | ICD-10-CM

## 2018-05-07 DIAGNOSIS — R0789 Other chest pain: Secondary | ICD-10-CM

## 2018-05-07 NOTE — Patient Instructions (Signed)
Medication Instructions: Your physician recommends that you continue on your current medications as directed. Please refer to the Current Medication list given to you today.   Testing/Procedures: Your physician has recommended that you have a sleep study. This test records several body functions during sleep, including: brain activity, eye movement, oxygen and carbon dioxide blood levels, heart rate and rhythm, breathing rate and rhythm, the flow of air through your mouth and nose, snoring, body muscle movements, and chest and belly movement.  Follow-Up: Your physician wants you to follow-up in: 6 months with Dr. Berry. You will receive a reminder letter in the mail two months in advance. If you don't receive a letter, please call our office to schedule the follow-up appointment.  If you need a refill on your cardiac medications before your next appointment, please call your pharmacy.  

## 2018-05-07 NOTE — Progress Notes (Signed)
05/07/2018 Bonnie Carr   May 26, 1981  [id number]  Primary Physician Elias Else, MD Primary Cardiologist: Runell Gess MD FACP, [place], [place], [place]  HPI:  Bonnie Carr is a 37 y.o. severely overweight single African-American female who unfortunately lost her child right after birth 03/16/2018.  She was referred to me by Dr. Mora Appl, her OB/GYN, for palpitations.  She has no cardiac risk factors other than tobacco abuse currently smoking 1 pack/week.  Her mother did have a myocardial infarction at age 73.  She is never had a heart attack or stroke.  She does complain of some atypical chest pain when running on the treadmill.  She had palpitations during her pregnancy which have significantly improved since giving birth 6 weeks ago she had 2 short episodes lasting seconds at a time.  She also gives symptoms compatible with obstructive sleep apnea.   Current Meds  Medication Sig  . acetaminophen (TYLENOL) 500 MG tablet Take 500 mg by mouth every 6 (six) hours as needed for mild pain or headache.  . calcium carbonate (TUMS - DOSED IN MG ELEMENTAL CALCIUM) 500 MG chewable tablet Chew 1 tablet by mouth 2 (two) times daily as needed for indigestion or heartburn.  . cetirizine (ZYRTEC) 10 MG tablet Take 10 mg by mouth daily as needed for allergies or rhinitis.   . ferrous sulfate 325 (65 FE) MG EC tablet Take 325 mg by mouth daily with breakfast.  . ibuprofen (ADVIL,MOTRIN) 600 MG tablet Take 1 tablet (600 mg total) by mouth every 6 (six) hours.  . Prenatal Vit-Fe Fumarate-FA (PRENATAL MULTIVITAMIN) TABS tablet Take 1 tablet by mouth daily at 12 noon.  . ranitidine (ZANTAC) 75 MG tablet Take 75 mg by mouth 2 (two) times daily.  . sertraline (ZOLOFT) 50 MG tablet Take 1 tablet by mouth daily.  Marland Kitchen zolpidem (AMBIEN) 5 MG tablet Take 1 tablet (5 mg total) by mouth at bedtime as needed for sleep.     Allergies  Allergen Reactions  . Other Hives and Rash    HOUSE DUST, WEED POLLEN    Social  History   Socioeconomic History  . Marital status: Single    Spouse name: Not on file  . Number of children: Not on file  . Years of education: Not on file  . Highest education level: Not on file  Occupational History  . Not on file  Social Needs  . Financial resource strain: Not on file  . Food insecurity:    Worry: Not on file    Inability: Not on file  . Transportation needs:    Medical: Not on file    Non-medical: Not on file  Tobacco Use  . Smoking status: Current Some Day Smoker    Packs/day: 0.50    Types: Cigarettes    Last attempt to quit: 10/31/2017    Years since quitting: 0.5  . Smokeless tobacco: Never Used  Substance and Sexual Activity  . Alcohol use: No    Frequency: Never    Comment: wine occassioanlly  . Drug use: No  . Sexual activity: Not Currently    Birth control/protection: None  Lifestyle  . Physical activity:    Days per week: Not on file    Minutes per session: Not on file  . Stress: Not on file  Relationships  . Social connections:    Talks on phone: Not on file    Gets together: Not on file    Attends religious service: Not  on file    Active member of club or organization: Not on file    Attends meetings of clubs or organizations: Not on file    Relationship status: Not on file  . Intimate partner violence:    Fear of current or ex partner: Not on file    Emotionally abused: Not on file    Physically abused: Not on file    Forced sexual activity: Not on file  Other Topics Concern  . Not on file  Social History Narrative  . Not on file     Review of Systems: General: negative for chills, fever, night sweats or weight changes.  Cardiovascular: negative for chest pain, dyspnea on exertion, edema, orthopnea, palpitations, paroxysmal nocturnal dyspnea or shortness of breath Dermatological: negative for rash Respiratory: negative for cough or wheezing Urologic: negative for hematuria Abdominal: negative for nausea, vomiting, diarrhea,  bright red blood per rectum, melena, or hematemesis Neurologic: negative for visual changes, syncope, or dizziness All other systems reviewed and are otherwise negative except as noted above.    Blood pressure 132/64, pulse 86, height  (1.676 m), weight 253 lb (114.8 kg), last menstrual period 10/08/2017, unknown if currently breastfeeding.  General appearance: alert and no distress Neck: no adenopathy, no carotid bruit, no JVD, supple, symmetrical, trachea midline and thyroid not enlarged, symmetric, no tenderness/mass/nodules Lungs: clear to auscultation bilaterally Heart: regular rate and rhythm, S1, S2 normal, no murmur, click, rub or gallop Extremities: extremities normal, atraumatic, no cyanosis or edema Pulses: 2+ and symmetric Skin: Skin color, texture, turgor normal. No rashes or lesions Neurologic: Alert and oriented X 3, normal strength and tone. Normal symmetric reflexes. Normal coordination and gait  EKG sinus rhythm at 86 without ST or T wave changes.  I personally reviewed this EKG.  ASSESSMENT AND PLAN:   Palpitations [name] was referred to me by her OB/GYN Dr. Mora Appl, for evaluation of palpitations.  She was pregnant up until 03/16/2018 when she had a spontaneous vaginal delivery and unfortunately her child expired an hour later.  She did complain of excessive palpitations at one point during her pregnancy however in the last 6 weeks she is only had 2 short episodes lasting only seconds at a time.  I am not concerned about these.  There may actually be related to sleep apnea.  I am not going to get an event monitor or other noninvasive tests until I see her back in 6 months.  Atypical chest pain History of atypical chest pain especially when she exercises.  She was evaluated by Dr. Dietrich Pates back in 2014 who also thought her pain was musculoskeletal in nature.  She has no cardiac risk factors.  No further work-up is required at this time.  Obstructive sleep  apnea History of snoring and daytime somnolence with palpitations.  I believe that she has symptoms of obstructive sleep apnea.  I am going to order a sleep study to further evaluate.      Runell Gess MD FACP,FACC,FAHA, [facility] 05/07/2018 9:57 AM

## 2018-05-07 NOTE — Assessment & Plan Note (Signed)
History of atypical chest pain especially when she exercises.  She was evaluated by Dr. Dietrich Pates back in 2014 who also thought her pain was musculoskeletal in nature.  She has no cardiac risk factors.  No further work-up is required at this time.

## 2018-05-07 NOTE — Assessment & Plan Note (Signed)
History of snoring and daytime somnolence with palpitations.  I believe that she has symptoms of obstructive sleep apnea.  I am going to order a sleep study to further evaluate.

## 2018-05-07 NOTE — Telephone Encounter (Signed)
Attempted to contact patient to give her the sleep study appointment details. Could not leave a message to call back. Voice mail box full. Will attempt to reach patient at a later time.

## 2018-05-07 NOTE — Assessment & Plan Note (Signed)
Bonnie Carr was referred to me by her OB/GYN Dr. Mora Appl, for evaluation of palpitations.  She was pregnant up until 03/16/2018 when she had a spontaneous vaginal delivery and unfortunately her child expired an hour later.  She did complain of excessive palpitations at one point during her pregnancy however in the last 6 weeks she is only had 2 short episodes lasting only seconds at a time.  I am not concerned about these.  There may actually be related to sleep apnea.  I am not going to get an event monitor or other noninvasive tests until I see her back in 6 months.

## 2018-05-08 ENCOUNTER — Telehealth: Payer: Self-pay | Admitting: *Deleted

## 2018-05-08 NOTE — Telephone Encounter (Signed)
Patient notified of 05/25/18 in lab sleep study appointment.

## 2018-05-25 ENCOUNTER — Encounter (HOSPITAL_BASED_OUTPATIENT_CLINIC_OR_DEPARTMENT_OTHER): Payer: BLUE CROSS/BLUE SHIELD

## 2018-10-07 ENCOUNTER — Encounter (HOSPITAL_COMMUNITY): Payer: Self-pay

## 2020-03-22 NOTE — Progress Notes (Addendum)
Cardiology Office Note  Date: 03/23/2020   ID: Bonnie Carr, DOB 11-Oct-1981, MRN [id number]  PCP:  Elias Else, MD  Cardiologist:  No primary care provider on file. Electrophysiologist:  None   Chief Complaint: Follow-up  palpitations  History of Present Illness: Bonnie Carr is a 39 y.o. female with a history of palpitations, severe obesity, atypical chest pain when running on a treadmill or exercising, symptoms of obstructive sleep apnea, smoker.  She has a family history of her mother who had an MI at 32.  Patient was last seen by Dr. Allyson Sabal May 07, 2018.  Patient had been having palpitations during pregnancy.  She had delivered a child who died shortly after birth.  She had 2 short episodes of palpitations postpartum.  Dr. Allyson Sabal had plan to order a sleep study due to symptoms of sleep apnea and a Holter monitor to check for arrhythmias.  There is no evidence in the chart of either a sleep study or a Holter monitor being performed.   Patient states she been doing well since last seen in May 2019.  She is pregnant now and approximately [redacted] weeks gestation.  She states she had one episode of palpitations since the last visit which lasted approximately 30 seconds and resolved.  She had no associated presyncope or syncopal episode, or orthostatic symptoms.  She states previously she was supposed to have a sleep study but Covid happened and this never occurred.  She admits to snoring sometimes and has been told she has occasional periods of apnea.  She states normally when she sleeps an adequate amount, she does not feel excessively sleepy during the daytime.  She states she does feel a little tired but attributes this to the pregnancy. She denies any use of stimulants such as caffeine, tea, or energy drinks.    Patient states during  previous visit with Dr. Allyson Sabal.  She was pregnant and was going through some stress with her family with her mother recently passing away during that time which  may have contributed to stress-induced palpitations.   Past Medical History:  Diagnosis Date  . GERD (gastroesophageal reflux disease)   . Irregular heart beats   . Kidney stone   . Kidney stones   . Pre-diabetes   . Trichomonas     Past Surgical History:  Procedure Laterality Date  . CERVICAL CERCLAGE N/A 03/14/2018   Procedure: CERCLAGE CERVICAL;  Surgeon: Osborn Coho, MD;  Location: [facility];  Service: Gynecology;  Laterality: N/A;  . CHOLECYSTECTOMY    . HYSTEROSCOPY WITH D & C N/A 08/24/2016   Procedure: DILATATION AND CURETTAGE /HYSTEROSCOPY;  Surgeon: Essie Hart, MD;  Location: [facility];  Service: Gynecology;  Laterality: N/A;  . TONSILLECTOMY      Current Outpatient Medications  Medication Sig Dispense Refill  . acetaminophen (TYLENOL) 500 MG tablet Take 500 mg by mouth every 6 (six) hours as needed for mild pain or headache.    . calcium carbonate (TUMS - DOSED IN MG ELEMENTAL CALCIUM) 500 MG chewable tablet Chew 1 tablet by mouth 2 (two) times daily as needed for indigestion or heartburn.    . cetirizine (ZYRTEC) 10 MG tablet Take 10 mg by mouth daily as needed for allergies or rhinitis.     . ferrous sulfate 325 (65 FE) MG EC tablet Take 325 mg by mouth daily with breakfast.    . ibuprofen (ADVIL,MOTRIN) 600 MG tablet Take 1 tablet (600 mg total) by mouth every 6 (six)  hours. 30 tablet 0  . Prenatal Vit-Fe Fumarate-FA (PRENATAL MULTIVITAMIN) TABS tablet Take 1 tablet by mouth daily at 12 noon.    . ranitidine (ZANTAC) 75 MG tablet Take 75 mg by mouth 2 (two) times daily.    . sertraline (ZOLOFT) 50 MG tablet Take 1 tablet by mouth daily.  2  . zolpidem (AMBIEN) 5 MG tablet Take 1 tablet (5 mg total) by mouth at bedtime as needed for sleep. 30 tablet 0   No current facility-administered medications for this visit.   Allergies:  Other   Social History: The patient  reports that she has been smoking cigarettes. She has been smoking about 0.50 packs per day. She has never  used smokeless tobacco. She reports that she does not drink alcohol or use drugs.   Family History: The patient's family history includes Cancer in her maternal grandmother; Diabetes in her father and maternal grandmother; Heart disease in her mother; Hypertension in her mother.   ROS:  Please see the history of present illness. Otherwise, complete review of systems is positive for none.  All other systems are reviewed and negative.   Physical Exam: VS:  There were no vitals taken for this visit., BMI There is no height or weight on file to calculate BMI.  Wt Readings from Last 3 Encounters:  05/07/18 253 lb (114.8 kg)  03/13/18 257 lb (116.6 kg)  02/20/18 258 lb (117 kg)    General: Patient appears comfortable at rest. Neck: Supple, no elevated JVP or carotid bruits, no thyromegaly. Lungs: Clear to auscultation, nonlabored breathing at rest. Cardiac: Regular rate and rhythm, no S3 or significant systolic murmur, no pericardial rub.  Extremities: No pitting edema, distal pulses 2+. Skin: Warm and dry. Musculoskeletal: No kyphosis. Neuropsychiatric: Alert and oriented x3, affect grossly appropriate.  ECG:  An ECG dated 03/23/2020 was personally reviewed today and demonstrated:  Sinus rhythm rate of 77, no acute ST or T wave abnormalities, normal axis, no voltage criteria for LVH  Recent Labwork: No results found for requested labs within last 8760 hours.     Component Value Date/Time   CHOL 152 12/04/2013 1251   TRIG 110.0 12/04/2013 1251   HDL 34.20 (L) 12/04/2013 1251   CHOLHDL 4 12/04/2013 1251   VLDL 22.0 12/04/2013 1251   LDLCALC 96 12/04/2013 1251    Other Studies Reviewed Today:  NONE  Assessment and Plan:  1. Palpitations    1. Palpitations Patient states she is been doing well from a cardiac standpoint since last visit with Dr. Gery Pray in 2019.  She states she has had one episode which was brief approximately 30 seconds where she had palpitations but no other  symptoms.  This resolved and she has had no further recurrences.  She is [redacted] weeks gestation.  Her previous palpitations occurred during her previous pregnancy.  I do not see any need for Holter monitor since the palpitations have been significantly infrequent.  Advised the patient to follow-up with her OB/GYN who will be seeing her soon and will likely order lab test.  Asked her to have the OB/GYN order her thyroid panel to make sure her thyroid is not influencing any potential palpitations or arrhythmias.    2. Snoring At last visit with Dr. Allyson Sabal patient was having some snoring issues.  His plan was to have her undergo a sleep study.  Patient is currently [redacted] weeks gestation of a pregnancy.  It is likely she could not tolerate a sleep study currently.  She states she does have some snoring symptoms and friends have told her she sometimes pauses her breathing during sleep.  She however states if she sleeps an adequate amount of time she does not have excessive daytime sleepiness.  She states she believes her fatigue comes from her current state of pregnancy.  Will defer for now  Medication Adjustments/Labs and Tests Ordered: Current medicines are reviewed at length with the patient today.  Concerns regarding medicines are outlined above.   Disposition: Follow-up with Dr Gwenlyn Found 1 year  Signed, Levell July, NP 03/23/2020 8:01 AM    [place]

## 2020-03-23 ENCOUNTER — Ambulatory Visit (INDEPENDENT_AMBULATORY_CARE_PROVIDER_SITE_OTHER): Payer: BC Managed Care – PPO | Admitting: Family Medicine

## 2020-03-23 ENCOUNTER — Encounter: Payer: Self-pay | Admitting: Family Medicine

## 2020-03-23 ENCOUNTER — Other Ambulatory Visit: Payer: Self-pay

## 2020-03-23 ENCOUNTER — Encounter (INDEPENDENT_AMBULATORY_CARE_PROVIDER_SITE_OTHER): Payer: Self-pay

## 2020-03-23 VITALS — BP 128/82 | HR 77 | Temp 96.4°F | Ht 66.0 in | Wt 256.8 lb

## 2020-03-23 DIAGNOSIS — R002 Palpitations: Secondary | ICD-10-CM

## 2020-03-23 DIAGNOSIS — Z87898 Personal history of other specified conditions: Secondary | ICD-10-CM

## 2020-03-23 NOTE — Patient Instructions (Addendum)
Medication Instructions:   Your physician recommends that you continue on your current medications as directed. Please refer to the Current Medication list given to you today.  *If you need a refill on your cardiac medications before your next appointment, please call your pharmacy*  Lab Work:  None ordered today  Testing/Procedures:  None ordered today  Follow-Up: At [facility], you and your health needs are our priority.  As part of our continuing mission to provide you with exceptional heart care, we have created designated Provider Care Teams.  These Care Teams include your primary Cardiologist (physician) and Advanced Practice Providers (APPs -  Physician Assistants and Nurse Practitioners) who all work together to provide you with the care you need, when you need it.  We recommend signing up for the patient portal called "MyChart".  Sign up information is provided on this After Visit Summary.  MyChart is used to connect with patients for Virtual Visits (Telemedicine).  Patients are able to view lab/test results, encounter notes, upcoming appointments, etc.  Non-urgent messages can be sent to your provider as well.   To learn more about what you can do with MyChart, go to [email or web address].    Your next appointment:   12 month(s)  The format for your next appointment:   In Person  Provider:   You may see Nanetta Batty, MD or one of the following Advanced Practice Providers on your designated Care Team:    Corine Shelter, PA-C  [place], [place]  Edd Fabian, [place]   Other Instructions  Call the office if you experience any worsening palpitations before your one year follow up

## 2020-03-28 ENCOUNTER — Inpatient Hospital Stay (HOSPITAL_COMMUNITY): Payer: BC Managed Care – PPO

## 2020-03-28 ENCOUNTER — Inpatient Hospital Stay (HOSPITAL_COMMUNITY)
Admission: AD | Admit: 2020-03-28 | Discharge: 2020-03-28 | Disposition: A | Discharge status: Home / Self Care | Payer: BC Managed Care – PPO | Attending: Obstetrics & Gynecology | Admitting: Obstetrics & Gynecology

## 2020-03-28 ENCOUNTER — Other Ambulatory Visit: Payer: Self-pay

## 2020-03-28 ENCOUNTER — Encounter (HOSPITAL_COMMUNITY): Payer: Self-pay | Admitting: Obstetrics & Gynecology

## 2020-03-28 DIAGNOSIS — O26899 Other specified pregnancy related conditions, unspecified trimester: Secondary | ICD-10-CM

## 2020-03-28 DIAGNOSIS — Z3A08 8 weeks gestation of pregnancy: Secondary | ICD-10-CM | POA: Insufficient documentation

## 2020-03-28 DIAGNOSIS — O3680X0 Pregnancy with inconclusive fetal viability, not applicable or unspecified: Secondary | ICD-10-CM | POA: Diagnosis not present

## 2020-03-28 DIAGNOSIS — F17210 Nicotine dependence, cigarettes, uncomplicated: Secondary | ICD-10-CM | POA: Diagnosis not present

## 2020-03-28 DIAGNOSIS — R109 Unspecified abdominal pain: Secondary | ICD-10-CM | POA: Insufficient documentation

## 2020-03-28 DIAGNOSIS — O209 Hemorrhage in early pregnancy, unspecified: Secondary | ICD-10-CM | POA: Diagnosis not present

## 2020-03-28 DIAGNOSIS — O99331 Smoking (tobacco) complicating pregnancy, first trimester: Secondary | ICD-10-CM | POA: Diagnosis not present

## 2020-03-28 LAB — URINALYSIS, ROUTINE W REFLEX MICROSCOPIC
Bilirubin Urine: NEGATIVE
Glucose, UA: NEGATIVE mg/dL
Ketones, ur: NEGATIVE mg/dL
Leukocytes,Ua: NEGATIVE
Nitrite: NEGATIVE
Protein, ur: NEGATIVE mg/dL
Specific Gravity, Urine: 1.019 (ref 1.005–1.030)
pH: 5 (ref 5.0–8.0)

## 2020-03-28 LAB — CBC
HCT: 37.3 % (ref 36.0–46.0)
Hemoglobin: 12.1 g/dL (ref 12.0–15.0)
MCH: 28.3 pg (ref 26.0–34.0)
MCHC: 32.4 g/dL (ref 30.0–36.0)
MCV: 87.1 fL (ref 80.0–100.0)
Platelets: 445 10*3/uL — ABNORMAL HIGH (ref 150–400)
RBC: 4.28 MIL/uL (ref 3.87–5.11)
RDW: 14.6 % (ref 11.5–15.5)
WBC: 6.7 10*3/uL (ref 4.0–10.5)
nRBC: 0 % (ref 0.0–0.2)

## 2020-03-28 LAB — WET PREP, GENITAL
Clue Cells Wet Prep HPF POC: NONE SEEN
Sperm: NONE SEEN
Trich, Wet Prep: NONE SEEN
Yeast Wet Prep HPF POC: NONE SEEN

## 2020-03-28 LAB — POCT PREGNANCY, URINE: Preg Test, Ur: POSITIVE — AB

## 2020-03-28 LAB — HCG, QUANTITATIVE, PREGNANCY: hCG, Beta Chain, Quant, S: 4047 m[IU]/mL — ABNORMAL HIGH (ref <5)

## 2020-03-28 NOTE — MAU Provider Note (Signed)
Chief Complaint: Abdominal Pain and Vaginal Bleeding   First Provider Initiated Contact with Patient 03/28/20 1711     SUBJECTIVE HPI: Bonnie Carr is a 39 y.o. G5P0130 at [redacted]w[redacted]d by LMP who presents to [facility] reporting vaginal bleeding and abdominal cramping. Symptoms started this morning. Reports pink spotting on toilet paper. Had some white discharge over the weekend. Not having to wear a pad and not passing clots. Some lower abdominal cramping. Denies n/v/d, dysuria. Oral sex yesterday. Has first appointment with [facility] next week.   Location: abdomen Quality: cramping Severity: 3/10 on pain scale Duration: 1 day Timing: intermittent Modifying factors: none  Associated signs and symptoms: vaginal spotting  Past Medical History:  Diagnosis Date  . GERD (gastroesophageal reflux disease)   . Irregular heart beats   . Kidney stone   . Pre-diabetes   . Trichomonas    OB History  Gravida Para Term Preterm AB Living  5 1   1 3     SAB TAB Ectopic Multiple Live Births  1 2     1     # Outcome Date GA Lbr Len/2nd Weight Sex Delivery Anes PTL Lv  5 Current           4 Preterm 03/16/18 [redacted]w[redacted]d    Vag-Spont  Y ND  3 SAB           2 TAB              Birth Comments: System Generated. Please review and update pregnancy details.  1 TAB            Past Surgical History:  Procedure Laterality Date  . CERVICAL CERCLAGE N/A 03/14/2018   Procedure: CERCLAGE CERVICAL;  Surgeon: Everett Graff, MD;  Location: [facility];  Service: Gynecology;  Laterality: N/A;  . CHOLECYSTECTOMY    . HYSTEROSCOPY WITH D & C N/A 08/24/2016   Procedure: DILATATION AND CURETTAGE /HYSTEROSCOPY;  Surgeon: Sanjuana Kava, MD;  Location: [facility];  Service: Gynecology;  Laterality: N/A;  . TONSILLECTOMY     Social History   Socioeconomic History  . Marital status: Single    Spouse name: Not on file  . Number of children: Not on file  . Years of education: Not on file  . Highest education level: Not on file   Occupational History  . Not on file  Tobacco Use  . Smoking status: Current Some Day Smoker    Packs/day: 0.50    Types: Cigarettes    Last attempt to quit: 10/31/2017    Years since quitting: 2.4  . Smokeless tobacco: Never Used  Substance and Sexual Activity  . Alcohol use: No    Comment: wine occassioanlly  . Drug use: No  . Sexual activity: Not Currently    Birth control/protection: None  Other Topics Concern  . Not on file  Social History Narrative  . Not on file   Social Determinants of Health   Financial Resource Strain:   . Difficulty of Paying Living Expenses:   Food Insecurity:   . Worried About Charity fundraiser in the Last Year:   . Arboriculturist in the Last Year:   Transportation Needs:   . Film/video editor (Medical):   Marland Kitchen Lack of Transportation (Non-Medical):   Physical Activity:   . Days of Exercise per Week:   . Minutes of Exercise per Session:   Stress:   . Feeling of Stress :   Social Connections:   . Frequency of  Communication with Friends and Family:   . Frequency of Social Gatherings with Friends and Family:   . Attends Religious Services:   . Active Member of Clubs or Organizations:   . Attends Banker Meetings:   Marland Kitchen Marital Status:   Intimate Partner Violence:   . Fear of Current or Ex-Partner:   . Emotionally Abused:   Marland Kitchen Physically Abused:   . Sexually Abused:    Family History  Problem Relation Age of Onset  . Hypertension Mother   . Heart disease Mother   . Diabetes Father   . Diabetes Maternal Grandmother   . Cancer Maternal Grandmother    No current facility-administered medications on file prior to encounter.   Current Outpatient Medications on File Prior to Encounter  Medication Sig Dispense Refill  . cetirizine (ZYRTEC) 10 MG tablet Take 10 mg by mouth daily as needed for allergies or rhinitis.     . Prenatal Vit-Fe Fumarate-FA (PRENATAL MULTIVITAMIN) TABS tablet Take 1 tablet by mouth daily at 12 noon.     Marland Kitchen acetaminophen (TYLENOL) 500 MG tablet Take 500 mg by mouth every 6 (six) hours as needed for mild pain or headache.    . calcium carbonate (TUMS - DOSED IN MG ELEMENTAL CALCIUM) 500 MG chewable tablet Chew 1 tablet by mouth 2 (two) times daily as needed for indigestion or heartburn.     Allergies  Allergen Reactions  . Other Hives and Rash    HOUSE DUST, WEED POLLEN    I have reviewed patient's Past Medical Hx, Surgical Hx, Family Hx, Social Hx, medications and allergies.   Review of Systems  Constitutional: Negative.   Gastrointestinal: Positive for abdominal pain.  Genitourinary: Positive for vaginal bleeding. Negative for vaginal discharge.    OBJECTIVE Patient Vitals for the past 24 hrs:  BP Temp Pulse Resp SpO2 Weight  03/28/20 1700 140/89 98.6 F (37 C) 85 16 100 % 116.5 kg   Constitutional: Well-developed, well-nourished female in no acute distress.  Cardiovascular: normal rate & rhythm, no murmur Respiratory: normal rate and effort. Lung sounds clear throughout GI: Abd soft, non-tender, Pos BS x 4. No guarding or rebound tenderness MS: Extremities nontender, no edema, normal ROM Neurologic: Alert and oriented x 4.  GU:     SPECULUM EXAM: NEFG, minimal amount of clear mucoid discharge with streak of bright red blood at os. No active bleeding. No abnormal discharge. Cervix pink/smooth/not friable  .    LAB RESULTS Results for orders placed or performed during the hospital encounter of 03/28/20 (from the past 24 hour(s))  Urinalysis, Routine w reflex microscopic     Status: Abnormal   Collection Time: 03/28/20  4:46 PM  Result Value Ref Range   Color, Urine YELLOW YELLOW   APPearance HAZY (A) CLEAR   Specific Gravity, Urine 1.019 1.005 - 1.030   pH 5.0 5.0 - 8.0   Glucose, UA NEGATIVE NEGATIVE mg/dL   Hgb urine dipstick MODERATE (A) NEGATIVE   Bilirubin Urine NEGATIVE NEGATIVE   Ketones, ur NEGATIVE NEGATIVE mg/dL   Protein, ur NEGATIVE NEGATIVE mg/dL   Nitrite  NEGATIVE NEGATIVE   Leukocytes,Ua NEGATIVE NEGATIVE   RBC / HPF 21-50 0 - 5 RBC/hpf   WBC, UA 0-5 0 - 5 WBC/hpf   Bacteria, UA RARE (A) NONE SEEN   Squamous Epithelial / LPF 0-5 0 - 5   Mucus PRESENT   Pregnancy, urine POC     Status: Abnormal   Collection Time: 03/28/20  4:49 PM  Result Value Ref Range   Preg Test, Ur POSITIVE (A) NEGATIVE  Wet prep, genital     Status: Abnormal   Collection Time: 03/28/20  5:18 PM  Result Value Ref Range   Yeast Wet Prep HPF POC NONE SEEN NONE SEEN   Trich, Wet Prep NONE SEEN NONE SEEN   Clue Cells Wet Prep HPF POC NONE SEEN NONE SEEN   WBC, Wet Prep HPF POC MANY (A) NONE SEEN   Sperm NONE SEEN   CBC     Status: Abnormal   Collection Time: 03/28/20  5:52 PM  Result Value Ref Range   WBC 6.7 4.0 - 10.5 K/uL   RBC 4.28 3.87 - 5.11 MIL/uL   Hemoglobin 12.1 12.0 - 15.0 g/dL   HCT 31.4 97.0 - 26.3 %   MCV 87.1 80.0 - 100.0 fL   MCH 28.3 26.0 - 34.0 pg   MCHC 32.4 30.0 - 36.0 g/dL   RDW 78.5 88.5 - 02.7 %   Platelets 445 (H) 150 - 400 K/uL   nRBC 0.0 0.0 - 0.2 %  hCG, quantitative, pregnancy     Status: Abnormal   Collection Time: 03/28/20  5:52 PM  Result Value Ref Range   hCG, Beta Chain, Quant, S 4,047 (H) <5 mIU/mL    IMAGING US OB LESS THAN 14 WEEKS WITH OB TRANSVAGINAL  Result Date: 03/28/2020 CLINICAL DATA:  Vaginal bleeding, abdominal pain EXAM: OBSTETRIC <14 WK [place] AND TRANSVAGINAL OB US TECHNIQUE: Both transabdominal and transvaginal ultrasound examinations were performed for complete evaluation of the gestation as well as the maternal uterus, adnexal regions, and pelvic cul-de-sac. Transvaginal technique was performed to assess early pregnancy. COMPARISON:  None. FINDINGS: Intrauterine gestational sac: Single Yolk sac:  Visualized. Embryo:  Not Visualized. Subchorionic hemorrhage:  None visualized. Maternal uterus/adnexae: Right ovary measures 2.8 x 2.1 by 1.4 cm and the left ovary measures 2.6 x 1.7 x 1.9 cm. No adnexal masses. Uterus  is grossly unremarkable. There are coarse shadowing calcifications within the cervix. IMPRESSION: 1. Probable early intrauterine gestational sac, but no fetal pole or cardiac activity yet visualized. Recommend follow-up quantitative B-HCG levels and follow-up US in 14 days to assess viability. This recommendation follows SRU consensus guidelines: Diagnostic Criteria for Nonviable Pregnancy Early in the First Trimester. Malva Limes Med 2013; 741:2878-67. Electronically Signed   By: Sharlet Salina M.D.   On: 03/28/2020 18:58    MAU COURSE Orders Placed This Encounter  Procedures  . Wet prep, genital  . US OB LESS THAN 14 WEEKS WITH OB TRANSVAGINAL  . Urinalysis, Routine w reflex microscopic  . CBC  . hCG, quantitative, pregnancy  . Pregnancy, urine POC  . Discharge patient   No orders of the defined types were placed in this encounter.   MDM +UPT UA, wet prep, GC/chlamydia, CBC, quant hCG, and [place] today to rule out ectopic pregnancy which can be life threatening.   RH positive No active bleeding on exam  Ultrasound shows IUGS with yolk sac. Patient states she is sure of LMP & has regular cycles. Should be 8 wks by LMP. Discussed with patient & her SO that if that's the case, this could be concerning for miscarriage, but at this time I don't have enough information to determine that. She has her first ob appointment with [facility] on 4/7. Informed her that appointment would be an appropriate time frame for follow up but she should call the office for further guidance. This note will be sent to  her office.   ASSESSMENT 1. Pregnancy with uncertain fetal viability, single or unspecified fetus   2. Vaginal bleeding in pregnancy, first trimester   3. Abdominal cramping affecting pregnancy     PLAN Discharge home in stable condition. SAB precautions GC/CT pending   Follow-up Information    [facility] Follow up.   Specialty: Obstetrics and Gynecology Contact  information: [address] [place] [address] [phone]         Allergies as of 03/28/2020      Reactions   Other Hives, Rash   HOUSE DUST, WEED POLLEN      Medication List    TAKE these medications   acetaminophen 500 MG tablet Commonly known as: TYLENOL Take 500 mg by mouth every 6 (six) hours as needed for mild pain or headache.   calcium carbonate 500 MG chewable tablet Commonly known as: TUMS - dosed in mg elemental calcium Chew 1 tablet by mouth 2 (two) times daily as needed for indigestion or heartburn.   cetirizine 10 MG tablet Commonly known as: ZYRTEC Take 10 mg by mouth daily as needed for allergies or rhinitis.   prenatal multivitamin Tabs tablet Take 1 tablet by mouth daily at 12 noon.        Judeth Horn, NP 03/28/2020  7:26 PM

## 2020-03-28 NOTE — Discharge Instructions (Signed)
Vaginal Bleeding During Pregnancy, First Trimester  A small amount of bleeding from the vagina (spotting) is relatively common during early pregnancy. It usually stops on its own. Various things may cause bleeding or spotting during early pregnancy. Some bleeding may be related to the pregnancy, and some may not. In many cases, the bleeding is normal and is not a problem. However, bleeding can also be a sign of something serious. Be sure to tell your health care provider about any vaginal bleeding right away. Some possible causes of vaginal bleeding during the first trimester include:  Infection or inflammation of the cervix.  Growths (polyps) on the cervix.  Miscarriage or threatened miscarriage.  Pregnancy tissue developing outside of the uterus (ectopic pregnancy).  A mass of tissue developing in the uterus due to an egg being fertilized incorrectly (molar pregnancy). Follow these instructions at home: Activity  Follow instructions from your health care provider about limiting your activity. Ask what activities are safe for you.  If needed, make plans for someone to help with your regular activities.  Do not have sex or orgasms until your health care provider says that this is safe. General instructions  Take over-the-counter and prescription medicines only as told by your health care provider.  Pay attention to any changes in your symptoms.  Do not use tampons or douche.  Write down how many pads you use each day, how often you change pads, and how soaked (saturated) they are.  If you pass any tissue from your vagina, save the tissue so you can show it to your health care provider.  Keep all follow-up visits as told by your health care provider. This is important. Contact a health care provider if:  You have vaginal bleeding during any part of your pregnancy.  You have cramps or labor pains.  You have a fever. Get help right away if:  You have severe cramps in your  back or abdomen.  You pass large clots or a large amount of tissue from your vagina.  Your bleeding increases.  You feel light-headed or weak, or you faint.  You have chills.  You are leaking fluid or have a gush of fluid from your vagina. Summary  A small amount of bleeding (spotting) from the vagina is relatively common during early pregnancy.  Various things may cause bleeding or spotting in early pregnancy.  Be sure to tell your health care provider about any vaginal bleeding right away. This information is not intended to replace advice given to you by your health care provider. Make sure you discuss any questions you have with your health care provider. Document Revised: 04/07/2019 Document Reviewed: 03/21/2017 Elsevier Patient Education  2020 Elsevier Inc.  

## 2020-03-28 NOTE — MAU Note (Signed)
.   Bonnie Carr is a 39 y.o. at [redacted]w[redacted]d here in MAU reporting: lower abdominal cramping for a couple of days. PT states she noticed vaginal spotting when she wiped today. Hx of a 22 week loss after cerclage in march 2019 LMP: 02/01/20 Onset of complaint: couple of days Pain score: 3 Vitals:   03/28/20 1700  BP: 140/89  Pulse: 85  Resp: 16  Temp: 98.6 F (37 C)  SpO2: 100%     FHT: Lab orders placed from triage: UA/UPT

## 2020-03-29 ENCOUNTER — Encounter (HOSPITAL_COMMUNITY): Payer: Self-pay | Admitting: Obstetrics & Gynecology

## 2020-03-29 ENCOUNTER — Inpatient Hospital Stay (HOSPITAL_COMMUNITY): Payer: BC Managed Care – PPO

## 2020-03-29 ENCOUNTER — Inpatient Hospital Stay (HOSPITAL_COMMUNITY)
Admission: AD | Admit: 2020-03-29 | Discharge: 2020-03-29 | Disposition: A | Discharge status: Home / Self Care | Payer: BC Managed Care – PPO | Attending: Obstetrics & Gynecology | Admitting: Obstetrics & Gynecology

## 2020-03-29 DIAGNOSIS — K219 Gastro-esophageal reflux disease without esophagitis: Secondary | ICD-10-CM | POA: Insufficient documentation

## 2020-03-29 DIAGNOSIS — F17210 Nicotine dependence, cigarettes, uncomplicated: Secondary | ICD-10-CM | POA: Insufficient documentation

## 2020-03-29 DIAGNOSIS — O99331 Smoking (tobacco) complicating pregnancy, first trimester: Secondary | ICD-10-CM | POA: Diagnosis not present

## 2020-03-29 DIAGNOSIS — Z3A08 8 weeks gestation of pregnancy: Secondary | ICD-10-CM | POA: Diagnosis not present

## 2020-03-29 DIAGNOSIS — O209 Hemorrhage in early pregnancy, unspecified: Secondary | ICD-10-CM

## 2020-03-29 DIAGNOSIS — O99611 Diseases of the digestive system complicating pregnancy, first trimester: Secondary | ICD-10-CM | POA: Insufficient documentation

## 2020-03-29 DIAGNOSIS — O26891 Other specified pregnancy related conditions, first trimester: Secondary | ICD-10-CM | POA: Diagnosis not present

## 2020-03-29 DIAGNOSIS — O039 Complete or unspecified spontaneous abortion without complication: Secondary | ICD-10-CM | POA: Diagnosis not present

## 2020-03-29 DIAGNOSIS — R109 Unspecified abdominal pain: Secondary | ICD-10-CM

## 2020-03-29 LAB — URINALYSIS, ROUTINE W REFLEX MICROSCOPIC
Bilirubin Urine: NEGATIVE
Glucose, UA: 100 mg/dL — AB
Ketones, ur: 15 mg/dL — AB
Nitrite: POSITIVE — AB
Protein, ur: >300 mg/dL — AB
Specific Gravity, Urine: 1.025 (ref 1.005–1.030)
pH: 5 (ref 5.0–8.0)

## 2020-03-29 LAB — URINALYSIS, MICROSCOPIC (REFLEX)

## 2020-03-29 LAB — GC/CHLAMYDIA PROBE AMP (~~LOC~~) NOT AT ARMC
Chlamydia: NEGATIVE
Comment: NEGATIVE
Comment: NORMAL
Neisseria Gonorrhea: NEGATIVE

## 2020-03-29 MED ORDER — ACETAMINOPHEN-CODEINE #3 300-30 MG PO TABS: 1.0000 | ORAL_TABLET | Freq: Four times a day (QID) | ORAL | 0 refills | Status: DC | PRN

## 2020-03-29 MED ORDER — OXYCODONE-ACETAMINOPHEN 5-325 MG PO TABS
2.0000 | ORAL_TABLET | Freq: Once | ORAL | Status: AC
  Administered 2020-03-29: 2 via ORAL
  Filled 2020-03-29: qty 2

## 2020-03-29 MED ORDER — PROMETHAZINE HCL 12.5 MG PO TABS: 12.5000 mg | ORAL_TABLET | Freq: Four times a day (QID) | ORAL | 0 refills | Status: DC | PRN

## 2020-03-29 MED ORDER — PROMETHAZINE HCL 25 MG PO TABS
12.5000 mg | ORAL_TABLET | Freq: Once | ORAL | Status: AC
  Administered 2020-03-29: 05:00:00 12.5 mg via ORAL
  Filled 2020-03-29: qty 1

## 2020-03-29 NOTE — Discharge Instructions (Signed)
Return to MAU:  If you have heavier bleeding that soaks through more that 2 pads per hour for an hour or more  If you bleed so much that you feel like you might pass out or you do pass out  If you have significant abdominal pain that is not improved with Tylenol #3 (that has been prescribed)  If you develop a fever > 100.5

## 2020-03-29 NOTE — MAU Note (Signed)
Pt reports to MAU c/o vaginal bleeding and cramping that woke her up pt states she went to the BR and it was nothing but blood (pt not wearing a pad). Pt reports the pain is 8/10 cramping. Pt states she took tylenol before bed. Pt denies dizziness and lightheadedness.

## 2020-03-29 NOTE — MAU Provider Note (Signed)
History     CSN: [id number]  Arrival date and time: 03/29/20 6063   First Provider Initiated Contact with Patient 03/29/20 0444      Chief Complaint  Patient presents with  . Vaginal Bleeding  . Abdominal Pain   Ms.  EMMAJANE ALTAMURA is a 39 y.o. year old G57P0130 female at [redacted]w[redacted]d weeks gestation who presents to MAU reporting heavier VB and abdominal cramping. When she went to the BR this morning she saw "nothing but blood"; not wearing a pad now. She took Tylenol before going to bed; pain rated 8/10. She denies dizziness or lightheadedness. She was seen in MAU earlier today:  CLINICAL DATA:  Vaginal bleeding, abdominal pain  EXAM: OBSTETRIC <14 WK [place] AND TRANSVAGINAL OB US  TECHNIQUE: Both transabdominal and transvaginal ultrasound examinations were performed for complete evaluation of the gestation as well as the maternal uterus, adnexal regions, and pelvic cul-de-sac. Transvaginal technique was performed to assess early pregnancy.  COMPARISON:  None.  FINDINGS: Intrauterine gestational sac: Single  Yolk sac:  Visualized.  Embryo:  Not Visualized.  Subchorionic hemorrhage:  None visualized.  Maternal uterus/adnexae: Right ovary measures 2.8 x 2.1 by 1.4 cm and the left ovary measures 2.6 x 1.7 x 1.9 cm. No adnexal masses.  Uterus is grossly unremarkable. There are coarse shadowing calcifications within the cervix.  IMPRESSION: 1. Probable early intrauterine gestational sac, but no fetal pole or cardiac activity yet visualized. Recommend follow-up quantitative B-HCG levels and follow-up US in 14 days to assess viability. This recommendation follows SRU consensus guidelines: Diagnostic Criteria for Nonviable Pregnancy Early in the First Trimester. Malva Limes Med 2013; 016:0109-32.   Electronically Signed   By: Sharlet Salina M.D.   On: 03/28/2020 18:58    OB History    Gravida  5   Para  1   Term      Preterm  1   AB  3   Living        SAB   1   TAB  2   Ectopic      Multiple      Live Births  1           Past Medical History:  Diagnosis Date  . GERD (gastroesophageal reflux disease)   . Irregular heart beats   . Kidney stone   . Pre-diabetes   . Trichomonas     Past Surgical History:  Procedure Laterality Date  . CERVICAL CERCLAGE N/A 03/14/2018   Procedure: CERCLAGE CERVICAL;  Surgeon: Osborn Coho, MD;  Location: [facility];  Service: Gynecology;  Laterality: N/A;  . CHOLECYSTECTOMY    . HYSTEROSCOPY WITH D & C N/A 08/24/2016   Procedure: DILATATION AND CURETTAGE /HYSTEROSCOPY;  Surgeon: Essie Hart, MD;  Location: [facility];  Service: Gynecology;  Laterality: N/A;  . TONSILLECTOMY      Family History  Problem Relation Age of Onset  . Hypertension Mother   . Heart disease Mother   . Diabetes Father   . Diabetes Maternal Grandmother   . Cancer Maternal Grandmother   . Stroke Maternal Uncle     Social History   Tobacco Use  . Smoking status: Current Some Day Smoker    Packs/day: 0.50    Types: Cigarettes    Last attempt to quit: 10/31/2017    Years since quitting: 2.4  . Smokeless tobacco: Never Used  Substance Use Topics  . Alcohol use: No    Comment: wine occassioanlly  . Drug use: Yes  Types: Marijuana    Allergies:  Allergies  Allergen Reactions  . Other Hives and Rash    HOUSE DUST, WEED POLLEN    Medications Prior to Admission  Medication Sig Dispense Refill Last Dose  . acetaminophen (TYLENOL) 500 MG tablet Take 500 mg by mouth every 6 (six) hours as needed for mild pain or headache.   03/28/2020 at Unknown time  . Prenatal Vit-Fe Fumarate-FA (PRENATAL MULTIVITAMIN) TABS tablet Take 1 tablet by mouth daily at 12 noon.   03/28/2020 at Unknown time  . calcium carbonate (TUMS - DOSED IN MG ELEMENTAL CALCIUM) 500 MG chewable tablet Chew 1 tablet by mouth 2 (two) times daily as needed for indigestion or heartburn.   Unknown at Unknown time  . cetirizine (ZYRTEC) 10 MG tablet Take 10 mg  by mouth daily as needed for allergies or rhinitis.    Unknown at Unknown time    Review of Systems  Constitutional: Negative.   HENT: Negative.   Eyes: Negative.   Respiratory: Negative.   Cardiovascular: Negative.   Gastrointestinal: Positive for nausea.  Endocrine: Negative.   Genitourinary: Positive for pelvic pain (rated 8/10) and vaginal bleeding.  Musculoskeletal: Negative.   Skin: Negative.   Allergic/Immunologic: Negative.   Neurological: Negative.   Hematological: Negative.   Psychiatric/Behavioral: Negative.    Physical Exam   Blood pressure 110/62, pulse 87, temperature 98.8 F (37.1 C), temperature source Oral, last menstrual period 02/01/2020, unknown if currently breastfeeding.  Physical Exam  Nursing note and vitals reviewed. Constitutional: She is oriented to person, place, and time. She appears well-developed and well-nourished.  HENT:  Head: Normocephalic and atraumatic.  Eyes: Pupils are equal, round, and reactive to light.  Cardiovascular: Normal rate.  Respiratory: Effort normal.  GI: Soft.  Genitourinary:    Genitourinary Comments: Moderate amount of thick, dark , red blood and clots in vaginal vault; removed with several large cotton swabs   Musculoskeletal:        General: Normal range of motion.     Cervical back: Normal range of motion.  Neurological: She is alert and oriented to person, place, and time.  Skin: Skin is warm and dry.  Psychiatric: She has a normal mood and affect. Her behavior is normal. Judgment and thought content normal.    MAU Course  Procedures  MDM CCUA UCx - Results pending Speculum Exam Percocet 5/325 mg 2 tablets -- resolved pain; 4/10 (down from 8/10) Phenergan 12.5 mg -- nausea resolved  Results for orders placed or performed during the hospital encounter of 03/29/20 (from the past 24 hour(s))  Urinalysis, Routine w reflex microscopic     Status: Abnormal   Collection Time: 03/29/20  4:05 AM  Result Value  Ref Range   Color, Urine RED (A) YELLOW   APPearance TURBID (A) CLEAR   Specific Gravity, Urine 1.025 1.005 - 1.030   pH 5.0 5.0 - 8.0   Glucose, UA 100 (A) NEGATIVE mg/dL   Hgb urine dipstick LARGE (A) NEGATIVE   Bilirubin Urine NEGATIVE NEGATIVE   Ketones, ur 15 (A) NEGATIVE mg/dL   Protein, ur >229 (A) NEGATIVE mg/dL   Nitrite POSITIVE (A) NEGATIVE   Leukocytes,Ua MODERATE (A) NEGATIVE  Urinalysis, Microscopic (reflex)     Status: Abnormal   Collection Time: 03/29/20  4:05 AM  Result Value Ref Range   RBC / HPF 21-50 0 - 5 RBC/hpf   WBC, UA 6-10 0 - 5 WBC/hpf   Bacteria, UA FEW (A) NONE SEEN   Squamous  Epithelial / LPF 0-5 0 - 5    CLINICAL DATA: Increased vaginal bleeding and cramping pelvic pain.  EXAM: OBSTETRIC <14 WK [place] AND TRANSVAGINAL OB US  TECHNIQUE: Both transabdominal and transvaginal ultrasound examinations were performed for complete evaluation of the gestation as well as the maternal uterus, adnexal regions, and pelvic cul-de-sac. Transvaginal technique was performed to assess early pregnancy.  COMPARISON: 03/28/2020  FINDINGS: Intrauterine gestational sac: None  Yolk sac: N/A  Embryo: N/A  Cardiac Activity: N/A  Heart Rate: N/A bpm  Subchorionic hemorrhage: N/A  Maternal uterus/adnexae: Mildly thickened endometrium and a small amount of endometrial fluid. The gestational sac seen on 03/28/2020 is no longer identified suggesting a completed spontaneous abortion. There is some fluid and debris in a slightly widened endocervical canal probably a combination of hematoma and gestational products.  Both ovaries are normal.  No free fluid.  IMPRESSION: Gestational sac seen on prior study from 2 days ago is no longer identified suggesting a completed spontaneous abortion.   Electronically Signed By: Marijo Sanes M.D. On: 03/29/2020 06:05 Assessment and Plan  Complete miscarriage - Plan: Discharge patient - Return to MAU:  If you have  heavier bleeding that soaks through more that 2 pads per hour for an hour or more  If you bleed so much that you feel like you might pass out or you do pass out  If you have significant abdominal pain that is not improved with Tylenol #3 (that has been prescribed)  If you develop a fever > 100.5 - Information provided on miscarriage, recurrent pregnancy loss and managing pregnancy loss - Comfort measures offered and comfort gifts given to patient and S.O.  - Advised to call [facility] to get scheduled for F/U to SAB and weekly HCG levels -- will forward note to office - Patient verbalized an understanding of the plan of care and agrees.     Laury Deep, CNM 03/29/2020, 4:44 AM

## 2020-04-01 LAB — CULTURE, OB URINE
Culture: 10000 — AB
Special Requests: NORMAL

## 2020-04-03 ENCOUNTER — Other Ambulatory Visit: Payer: Self-pay

## 2020-04-03 ENCOUNTER — Encounter (HOSPITAL_COMMUNITY): Payer: Self-pay | Admitting: Obstetrics and Gynecology

## 2020-04-03 ENCOUNTER — Inpatient Hospital Stay (HOSPITAL_COMMUNITY)
Admission: AD | Admit: 2020-04-03 | Discharge: 2020-04-03 | Disposition: A | Discharge status: Home / Self Care | Payer: BC Managed Care – PPO | Attending: Obstetrics and Gynecology | Admitting: Obstetrics and Gynecology

## 2020-04-03 DIAGNOSIS — F17210 Nicotine dependence, cigarettes, uncomplicated: Secondary | ICD-10-CM | POA: Insufficient documentation

## 2020-04-03 DIAGNOSIS — O039 Complete or unspecified spontaneous abortion without complication: Secondary | ICD-10-CM | POA: Insufficient documentation

## 2020-04-03 DIAGNOSIS — O209 Hemorrhage in early pregnancy, unspecified: Secondary | ICD-10-CM | POA: Diagnosis present

## 2020-04-03 LAB — CBC
HCT: 35.8 % — ABNORMAL LOW (ref 36.0–46.0)
Hemoglobin: 11.3 g/dL — ABNORMAL LOW (ref 12.0–15.0)
MCH: 27.7 pg (ref 26.0–34.0)
MCHC: 31.6 g/dL (ref 30.0–36.0)
MCV: 87.7 fL (ref 80.0–100.0)
Platelets: 447 10*3/uL — ABNORMAL HIGH (ref 150–400)
RBC: 4.08 MIL/uL (ref 3.87–5.11)
RDW: 14.7 % (ref 11.5–15.5)
WBC: 5.3 10*3/uL (ref 4.0–10.5)
nRBC: 0 % (ref 0.0–0.2)

## 2020-04-03 LAB — HCG, QUANTITATIVE, PREGNANCY: hCG, Beta Chain, Quant, S: 67 m[IU]/mL — ABNORMAL HIGH (ref <5)

## 2020-04-03 MED ORDER — FERROUS SULFATE 325 (65 FE) MG PO TABS: 325.0000 mg | ORAL_TABLET | ORAL | 0 refills | Status: DC

## 2020-04-03 MED ORDER — OXYCODONE HCL 5 MG PO TABS
5.0000 mg | ORAL_TABLET | Freq: Once | ORAL | Status: AC
Start: 2020-04-03 — End: 2020-04-03
  Administered 2020-04-03: 15:00:00 5 mg via ORAL
  Filled 2020-04-03: qty 1

## 2020-04-03 NOTE — MAU Note (Signed)
Bonnie Carr is a 39 y.o. here in MAU reporting: had a miscarriage on Tuesday. States "it just hasnt gotten better". Woke up this morning to pain, went to the bathroom and states she had a lot of bleeding into the toilet with multiple clots. The largest clot was quarter size. Bleeding has slowed down, is wearing a pad and has not had to change it.   Onset of complaint: ongoing  Pain score: 7/10  Vitals:   04/03/20 1416  BP: 125/69  Pulse: 70  Resp: 16  Temp: 98.5 F (36.9 C)  SpO2: 100%     Lab orders placed from triage: none

## 2020-04-03 NOTE — Discharge Instructions (Signed)
Managing Pregnancy Loss Pregnancy loss can happen any time during a pregnancy. Often the cause is not known. It is rarely because of anything you did. Pregnancy loss in early pregnancy (during the first trimester) is called a miscarriage. This type of pregnancy loss is the most common. Pregnancy loss that happens after 20 weeks of pregnancy is called fetal demise if the baby's heart stops beating before birth. Fetal demise is much less common. Some women experience spontaneous labor shortly after fetal demise resulting in a stillborn birth (stillbirth). Any pregnancy loss can be devastating. You will need to recover both physically and emotionally. Most women are able to get pregnant again after a pregnancy loss and deliver a healthy baby. How to manage emotional recovery  Pregnancy loss is very hard emotionally. You may feel many different emotions while you grieve. You may feel sad and angry. You may also feel guilty. It is normal to have periods of crying. Emotional recovery can take longer than physical recovery. It is different for everyone. Taking these steps can help you in managing this loss:  Remember that it is unlikely you did anything to cause the pregnancy loss.  Share your thoughts and feelings with friends, family, and your partner. Remember that your partner is also recovering emotionally.  Make sure you have a good support system. Do not spend too much time alone.  Meet with a pregnancy loss counselor or join a pregnancy loss support group.  Get enough sleep and eat a healthy diet. Return to regular exercise when you have recovered physically.  Do not use drugs or alcohol to manage your emotions.  Consider seeing a mental health professional to help you recover emotionally.  Ask a friend or loved one to help you decide what to do with any clothing and nursery items you received for your baby. In the case of a stillbirth, many women benefit from taking additional steps in the  grieving process. You may want to:  Hold your baby after the birth.  Name your baby.  Request a birth certificate.  Create a keepsake such as handprints or footprints.  Dress your baby and have a picture taken.  Make funeral arrangements.  Ask for a baptism or blessing. Hospitals have staff members who can help you with all these arrangements. How to recognize emotional stress It is normal to have emotional stress after a pregnancy loss. But emotional stress that lasts a long time or becomes severe requires treatment. Watch out for these signs of severe emotional stress:  Sadness, anger, or guilt that is not going away and is interfering with your normal activities.  Relationship problems that have occurred or gotten worse since the pregnancy loss.  Signs of depression that last longer than 2 weeks. These may include: ? Sadness. ? Anxiety. ? Hopelessness. ? Loss of interest in activities you enjoy. ? Inability to concentrate. ? Trouble sleeping or sleeping too much. ? Loss of appetite or overeating. ? Thoughts of death or of hurting yourself. Follow these instructions at home:  Take over-the-counter and prescription medicines only as told by your health care provider.  Rest at home until your energy level returns. Return to your normal activities as told by your health care provider. Ask your health care provider what activities are safe for you.  When you are ready, meet with your health care provider to discuss steps to take for a future pregnancy.  Keep all follow-up visits as told by your health care provider. This is important.   Where to find support  To help you and your partner with the process of grieving, talk with your health care provider or seek counseling.  Consider meeting with others who have experienced pregnancy loss. Ask your health care provider about support groups and resources. Where to find more information  [place] Department of Health and Human  Services Office on Women's Health: www.womenshealth.gov  American Pregnancy Association: www.americanpregnancy.org Contact a health care provider if:  You continue to experience grief, sadness, or lack of motivation for everyday activities, and those feelings do not improve over time.  You are struggling to recover emotionally, especially if you are using alcohol or substances to help. Get help right away if:  You have thoughts of hurting yourself or others. If you ever feel like you may hurt yourself or others, or have thoughts about taking your own life, get help right away. You can go to your nearest emergency department or call:  Your local emergency services (911 in the [place]  A suicide crisis helpline, such as the National Suicide Prevention Lifeline at 1-800-273-8255. This is open 24 hours a day. Summary  Any pregnancy loss can be difficult physically and emotionally.  You may experience many different emotions while you grieve. Emotional recovery can last longer than physical recovery.  It is normal to have emotional stress after a pregnancy loss. But emotional stress that lasts a long time or becomes severe requires treatment.  See your health care provider if you are struggling emotionally after a pregnancy loss. This information is not intended to replace advice given to you by your health care provider. Make sure you discuss any questions you have with your health care provider. Document Revised: 04/08/2019 Document Reviewed: 02/27/2018 Elsevier Patient Education  2020 Elsevier Inc.  

## 2020-04-03 NOTE — MAU Provider Note (Signed)
Chief Complaint: Abdominal Pain and Vaginal Bleeding   First Provider Initiated Contact with Patient 04/03/20 1432      SUBJECTIVE HPI: Bonnie Carr is a 39 y.o. G5P0130 at [redacted]w[redacted]d who presents to maternity admissions reporting pain and vaginal bleeding when she woke up this morning. Pain is noted on the sides of her abdomen and described as aching. In the middle of her abdomen she feels some pressure and "shooting pain." She does not feeling like "things are letting up." She has also felt dizzy today when moving around. Reports bleeding has improved some since earlier this morning but that it keeps changing colors. This morning blood was bright red with dark clots. Now she has a burgundy color of bleeding. Bleeding is heavier at night. She has not tried anything for pain. Denies concern for STDs.  She denies vaginal discharge/itching/burning, urinary symptoms, h/a, n/v, or fever/chills.    Past Medical History:  Diagnosis Date  . GERD (gastroesophageal reflux disease)   . Irregular heart beats   . Kidney stone   . Pre-diabetes   . Trichomonas    Past Surgical History:  Procedure Laterality Date  . CERVICAL CERCLAGE N/A 03/14/2018   Procedure: CERCLAGE CERVICAL;  Surgeon: Osborn Coho, MD;  Location: [facility];  Service: Gynecology;  Laterality: N/A;  . CHOLECYSTECTOMY    . HYSTEROSCOPY WITH D & C N/A 08/24/2016   Procedure: DILATATION AND CURETTAGE /HYSTEROSCOPY;  Surgeon: Essie Hart, MD;  Location: [facility];  Service: Gynecology;  Laterality: N/A;  . TONSILLECTOMY     Social History   Socioeconomic History  . Marital status: Single    Spouse name: Not on file  . Number of children: Not on file  . Years of education: Not on file  . Highest education level: Not on file  Occupational History  . Not on file  Tobacco Use  . Smoking status: Current Some Day Smoker    Packs/day: 0.50    Types: Cigarettes    Last attempt to quit: 10/31/2017    Years since quitting: 2.4  . Smokeless  tobacco: Never Used  Substance and Sexual Activity  . Alcohol use: No    Comment: wine occassioanlly  . Drug use: Yes    Types: Marijuana  . Sexual activity: Not Currently    Birth control/protection: None  Other Topics Concern  . Not on file  Social History Narrative  . Not on file   Social Determinants of Health   Financial Resource Strain:   . Difficulty of Paying Living Expenses:   Food Insecurity:   . Worried About Programme researcher, broadcasting/film/video in the Last Year:   . Barista in the Last Year:   Transportation Needs:   . Freight forwarder (Medical):   Marland Kitchen Lack of Transportation (Non-Medical):   Physical Activity:   . Days of Exercise per Week:   . Minutes of Exercise per Session:   Stress:   . Feeling of Stress :   Social Connections:   . Frequency of Communication with Friends and Family:   . Frequency of Social Gatherings with Friends and Family:   . Attends Religious Services:   . Active Member of Clubs or Organizations:   . Attends Banker Meetings:   Marland Kitchen Marital Status:   Intimate Partner Violence:   . Fear of Current or Ex-Partner:   . Emotionally Abused:   Marland Kitchen Physically Abused:   . Sexually Abused:    No current facility-administered medications on  file prior to encounter.   Current Outpatient Medications on File Prior to Encounter  Medication Sig Dispense Refill  . acetaminophen-codeine (TYLENOL #3) 300-30 MG tablet Take 1-2 tablets by mouth every 6 (six) hours as needed for moderate pain. 15 tablet 0  . calcium carbonate (TUMS - DOSED IN MG ELEMENTAL CALCIUM) 500 MG chewable tablet Chew 1 tablet by mouth 2 (two) times daily as needed for indigestion or heartburn.    . cetirizine (ZYRTEC) 10 MG tablet Take 10 mg by mouth daily as needed for allergies or rhinitis.     . Prenatal Vit-Fe Fumarate-FA (PRENATAL MULTIVITAMIN) TABS tablet Take 1 tablet by mouth daily at 12 noon.    . promethazine (PHENERGAN) 12.5 MG tablet Take 1 tablet (12.5 mg total)  by mouth every 6 (six) hours as needed for nausea or vomiting. 30 tablet 0   Allergies  Allergen Reactions  . Other Hives and Rash    HOUSE DUST, WEED POLLEN    ROS:  Review of Systems All other systems negative unless noted above in HPI.   I have reviewed patient's Past Medical Hx, Surgical Hx, Family Hx, Social Hx, medications and allergies.   Physical Exam   Patient Vitals for the past 24 hrs:  BP Temp Temp src Pulse Resp SpO2 Height Weight  04/03/20 1416 125/69 98.5 F (36.9 C) Oral 70 16 100 % -- --  04/03/20 1410 -- -- -- -- -- -- 5\' 6"  (1.676 m) 118.7 kg   Constitutional: Well-developed, well-nourished female in no acute distress.  Cardiovascular: normal rate Respiratory: normal effort GI: Abd soft, mild epigastric tenderness to palpation MS: Extremities nontender, no edema, normal ROM Neurologic: Alert and oriented x 4.  GU: Neg CVAT. PELVIC EXAM: Cervix pink, visually closed, without lesion, scant vaginal bleeding noted at a closed cervical os, vaginal walls and external genitalia normal Bimanual exam: Cervix 0/long/high, firm, anterior, neg CMT, uterus nontender, nonenlarged, adnexa without tenderness, enlargement, or mass  LAB RESULTS Results for orders placed or performed during the hospital encounter of 04/03/20 (from the past 24 hour(s))  CBC     Status: Abnormal   Collection Time: 04/03/20  3:00 PM  Result Value Ref Range   WBC 5.3 4.0 - 10.5 K/uL   RBC 4.08 3.87 - 5.11 MIL/uL   Hemoglobin 11.3 (L) 12.0 - 15.0 g/dL   HCT 35.8 (L) 36.0 - 46.0 %   MCV 87.7 80.0 - 100.0 fL   MCH 27.7 26.0 - 34.0 pg   MCHC 31.6 30.0 - 36.0 g/dL   RDW 14.7 11.5 - 15.5 %   Platelets 447 (H) 150 - 400 K/uL   nRBC 0.0 0.0 - 0.2 %  hCG, quantitative, pregnancy     Status: Abnormal   Collection Time: 04/03/20  3:00 PM  Result Value Ref Range   hCG, Beta Chain, Quant, S 67 (H) <5 mIU/mL       IMAGING US OB Transvaginal  Result Date: 03/29/2020 CLINICAL DATA:  Increased  vaginal bleeding and cramping pelvic pain. EXAM: OBSTETRIC <14 WK [place] AND TRANSVAGINAL OB US TECHNIQUE: Both transabdominal and transvaginal ultrasound examinations were performed for complete evaluation of the gestation as well as the maternal uterus, adnexal regions, and pelvic cul-de-sac. Transvaginal technique was performed to assess early pregnancy. COMPARISON:  03/28/2020 FINDINGS: Intrauterine gestational sac: None Yolk sac:  N/A Embryo:  N/A Cardiac Activity: N/A Heart Rate: N/A bpm Subchorionic hemorrhage:  N/A Maternal uterus/adnexae: Mildly thickened endometrium and a small amount of endometrial fluid.  The gestational sac seen on 03/28/2020 is no longer identified suggesting a completed spontaneous abortion. There is some fluid and debris in a slightly widened endocervical canal probably a combination of hematoma and gestational products. Both ovaries are normal. No free fluid. IMPRESSION: Gestational sac seen on prior study from 2 days ago is no longer identified suggesting a completed spontaneous abortion. Electronically Signed   By: Rudie Meyer M.D.   On: 03/29/2020 06:05   US OB LESS THAN 14 WEEKS WITH OB TRANSVAGINAL  Result Date: 03/28/2020 CLINICAL DATA:  Vaginal bleeding, abdominal pain EXAM: OBSTETRIC <14 WK [place] AND TRANSVAGINAL OB US TECHNIQUE: Both transabdominal and transvaginal ultrasound examinations were performed for complete evaluation of the gestation as well as the maternal uterus, adnexal regions, and pelvic cul-de-sac. Transvaginal technique was performed to assess early pregnancy. COMPARISON:  None. FINDINGS: Intrauterine gestational sac: Single Yolk sac:  Visualized. Embryo:  Not Visualized. Subchorionic hemorrhage:  None visualized. Maternal uterus/adnexae: Right ovary measures 2.8 x 2.1 by 1.4 cm and the left ovary measures 2.6 x 1.7 x 1.9 cm. No adnexal masses. Uterus is grossly unremarkable. There are coarse shadowing calcifications within the cervix. IMPRESSION: 1. Probable  early intrauterine gestational sac, but no fetal pole or cardiac activity yet visualized. Recommend follow-up quantitative B-HCG levels and follow-up US in 14 days to assess viability. This recommendation follows SRU consensus guidelines: Diagnostic Criteria for Nonviable Pregnancy Early in the First Trimester. Malva Limes Med 2013; 093:2671-24. Electronically Signed   By: Sharlet Salina M.D.   On: 03/28/2020 18:58    MAU Management/MDM: Orders Placed This Encounter  Procedures  . CBC  . hCG, quantitative, pregnancy  . Discharge patient    Meds ordered this encounter  Medications  . oxyCODONE (Oxy IR/ROXICODONE) immediate release tablet 5 mg  . ferrous sulfate 325 (65 FE) MG tablet    Sig: Take 1 tablet (325 mg total) by mouth every other day.    Dispense:  30 tablet    Refill:  0    Patient presented for continued bleeding with known spontaneous miscarriage. Vitals stable and exam benign. Scant blood on pelvic exam. Patient with some dizziness. CBC stable, mild drop in Hgb. PO iron prescribed on discharge and encouraged increased fluid intake. HCG with dramatic drop c/w almost complete miscarriage. Patient has f/u on 4/8 for repeat level. Patient was given Oxycodone with some improvement in pain. Discussed results with patient and support person and questions answered. Bleeding may continue for next several days but pain and bleeding should continually improve.  Pt discharged with strict return precautions.  ASSESSMENT 1. Spontaneous miscarriage   2. Complete miscarriage     PLAN Discharge home Allergies as of 04/03/2020      Reactions   Other Hives, Rash   HOUSE DUST, WEED POLLEN      Medication List    TAKE these medications   acetaminophen-codeine 300-30 MG tablet Commonly known as: TYLENOL #3 Take 1-2 tablets by mouth every 6 (six) hours as needed for moderate pain.   calcium carbonate 500 MG chewable tablet Commonly known as: TUMS - dosed in mg elemental calcium Chew 1  tablet by mouth 2 (two) times daily as needed for indigestion or heartburn.   cetirizine 10 MG tablet Commonly known as: ZYRTEC Take 10 mg by mouth daily as needed for allergies or rhinitis.   ferrous sulfate 325 (65 FE) MG tablet Take 1 tablet (325 mg total) by mouth every other day.   prenatal multivitamin Tabs  tablet Take 1 tablet by mouth daily at 12 noon.   promethazine 12.5 MG tablet Commonly known as: PHENERGAN Take 1 tablet (12.5 mg total) by mouth every 6 (six) hours as needed for nausea or vomiting.      Follow-up Information    Osborn Coho, MD Follow up.   Specialty: Obstetrics and Gynecology Contact information: [address] [place] [address] [phone]           Jerilynn Birkenhead, MD [facility] Fellow, [facility], [facility] 04/03/2020  4:04 PM

## 2020-05-21 ENCOUNTER — Ambulatory Visit: Payer: BC Managed Care – PPO | Attending: Internal Medicine

## 2020-05-21 DIAGNOSIS — Z23 Encounter for immunization: Secondary | ICD-10-CM

## 2020-05-21 NOTE — Progress Notes (Signed)
   Covid-19 Vaccination Clinic  Name:  Bonnie Carr    MRN: [id number] DOB: [birth year]  05/21/2020  [name] was observed post Covid-19 immunization for 15 minutes without incident. She was provided with Vaccine Information Sheet and instruction to access the V-Safe system.   [name] was instructed to call 911 with any severe reactions post vaccine: Marland Kitchen Difficulty breathing  . Swelling of face and throat  . A fast heartbeat  . A bad rash all over body  . Dizziness and weakness   Immunizations Administered    Name Date Dose VIS Date Route   Pfizer COVID-19 Vaccine 05/21/2020 10:09 AM 0.3 mL 02/24/2019 Intramuscular   Manufacturer: [employer], [employer]   Lot: [id number]   NDC: [address]-2

## 2020-06-13 ENCOUNTER — Ambulatory Visit: Payer: BC Managed Care – PPO | Attending: Internal Medicine

## 2020-06-13 DIAGNOSIS — Z23 Encounter for immunization: Secondary | ICD-10-CM

## 2020-06-13 NOTE — Progress Notes (Signed)
° °  Covid-19 Vaccination Clinic  Name:  KAROLYNE TIMMONS    MRN: [id number] DOB: 10-22-1981  06/13/2020  Ms. Shoun was observed post Covid-19 immunization for 15 minutes without incident. She was provided with Vaccine Information Sheet and instruction to access the V-Safe system.   Ms. Wierenga was instructed to call 911 with any severe reactions post vaccine:  Difficulty breathing   Swelling of face and throat   A fast heartbeat   A bad rash all over body   Dizziness and weakness   Immunizations Administered    Name Date Dose VIS Date Route   Pfizer COVID-19 Vaccine 06/13/2020 10:34 AM 0.3 mL 02/24/2019 Intramuscular   Manufacturer: [employer], [employer]   Lot: [id number]   NDC: [address]-2

## 2020-08-08 ENCOUNTER — Telehealth: Payer: Self-pay | Admitting: Cardiovascular Disease

## 2020-08-08 NOTE — Telephone Encounter (Signed)
Patient c/o Palpitations:  High priority if patient c/o lightheadedness, shortness of breath, or chest pain  1) How long have you had palpitations/irregular HR/ Afib? Are you having the symptoms now? Not experiencing symptoms now gotten more consistent over past month   2) Are you currently experiencing lightheadedness, SOB or CP? No   3) Do you have a history of afib (atrial fibrillation) or irregular heart rhythm? Yes   4) Have you checked your BP or HR? (document readings if available): No   5) Are you experiencing any other symptoms? Starts panicking to the point of SOB and sometimes CP occurs lightly like acid reflux

## 2020-08-08 NOTE — Telephone Encounter (Signed)
Spoke to patient she stated she has been having frequent palpitations for the past few weeks.No chest pain.Stated she went to [facility] ED back in June and was told she needs to have a stress test.Stated she has appointment already scheduled with Edd Fabian NP 8/26 at 8:15 am.Advised to avoid caffeine.Advised to keep appointment as planned.

## 2020-08-24 NOTE — Progress Notes (Signed)
Cardiology Clinic Note   Patient Name: Bonnie Carr Date of Encounter: 08/25/2020  Primary Care Provider:  Elias Else, MD Primary Cardiologist:  Nanetta Batty, MD  Patient Profile    Bonnie Carr 39 year old female presents for evaluation of irregular heart beat chest pain.  Past Medical History    Past Medical History:  Diagnosis Date  . GERD (gastroesophageal reflux disease)   . Irregular heart beats   . Kidney stone   . Pre-diabetes   . Trichomonas    Past Surgical History:  Procedure Laterality Date  . CERVICAL CERCLAGE N/A 03/14/2018   Procedure: CERCLAGE CERVICAL;  Surgeon: Osborn Coho, MD;  Location: [facility];  Service: Gynecology;  Laterality: N/A;  . CHOLECYSTECTOMY    . HYSTEROSCOPY WITH D & C N/A 08/24/2016   Procedure: DILATATION AND CURETTAGE /HYSTEROSCOPY;  Surgeon: Essie Hart, MD;  Location: [facility];  Service: Gynecology;  Laterality: N/A;  . TONSILLECTOMY      Allergies  Allergies  Allergen Reactions  . Latex Itching  . Other Hives and Rash    HOUSE DUST, WEED POLLEN    History of Present Illness    [name] has a PMH of OSA, nausea/vomiting, right upper quadrant pain, premature rupture of membranes, palpitations, atypical chest pain, complete miscarriage.  She presented to the [facility] ED on 06/03/2020 with complaints of left-sided chest pain.  She described pain as achy, and intermittent.  She describes having palpitations earlier today but they have since resolved.  She reported nausea but denied vomiting, diaphoresis, leg pain, lower extremity swelling, shortness of breath, fever, and chills.  Her blood pressure was 118/81 with a heart rate of 84 normal sinus rhythm.  Cardiac troponin normal, D-dimer normal, CXR showed no acute findings.  She contacted nurse triage line on 08/08/2020 and indicated that she was having frequent palpitations over the last few weeks.  She denied chest pain.  She indicated that when she went to the [facility] ED she was  told that she may need a stress test.  She was instructed to keep her follow-up appointment [facility].  She presents the clinic today for follow-up evaluation and states she has had intermittent periods of palpitations which cause her to be anxious.  She states that her mother and father both had heart disease.  She states that her mother had to have cardioversion as well.  Her palpitations are mainly noticed in the evening at rest.  She has recently begun to increase her physical activity walking 3 miles per day and states she does not notice any chest pain or palpitations during that time.  She states she continues to experience mild chest discomfort intermittently that is relieved with rest.  She states she does not know if this is related to heartburn.  She describes the pain as dull and burning sensation.  She denies exertional chest discomfort.  I will order a 14-day ZIO monitor, start her on Protonix, have her continue to increase her physical activity, give her the GERD diet information and have her follow-up in 8 weeks.  Today she denies chest pain, shortness of breath, lower extremity edema, fatigue, palpitations, melena, hematuria, hemoptysis, diaphoresis, weakness, presyncope, syncope, orthopnea, and PND.   Home Medications    Prior to Admission medications   Medication Sig Start Date End Date Taking? Authorizing Provider  acetaminophen-codeine (TYLENOL #3) 300-30 MG tablet Take 1-2 tablets by mouth every 6 (six) hours as needed for moderate pain. 03/29/20   Raelyn Mora, CNM  calcium carbonate (TUMS - DOSED IN MG ELEMENTAL CALCIUM) 500 MG chewable tablet Chew 1 tablet by mouth 2 (two) times daily as needed for indigestion or heartburn.    [provider]  cetirizine (ZYRTEC) 10 MG tablet Take 10 mg by mouth daily as needed for allergies or rhinitis.     [provider]  ferrous sulfate 325 (65 FE) MG tablet Take 1 tablet (325 mg total) by mouth every other day. 04/03/20  06/02/20  FairHoyle Sauer, MD  Prenatal Vit-Fe Fumarate-FA (PRENATAL MULTIVITAMIN) TABS tablet Take 1 tablet by mouth daily at 12 noon.    [provider]  promethazine (PHENERGAN) 12.5 MG tablet Take 1 tablet (12.5 mg total) by mouth every 6 (six) hours as needed for nausea or vomiting. 03/29/20   Raelyn Mora, CNM    Family History    Family History  Problem Relation Age of Onset  . Hypertension Mother   . Heart disease Mother   . Diabetes Father   . Diabetes Maternal Grandmother   . Cancer Maternal Grandmother   . Stroke Maternal Uncle    She indicated that her mother is deceased. She indicated that the status of her father is unknown. She indicated that the status of her maternal grandmother is unknown. She indicated that the status of her maternal uncle is unknown.  Social History    Social History   Socioeconomic History  . Marital status: Single    Spouse name: Not on file  . Number of children: Not on file  . Years of education: Not on file  . Highest education level: Not on file  Occupational History  . Not on file  Tobacco Use  . Smoking status: Current Some Day Smoker    Packs/day: 0.50    Types: Cigarettes    Last attempt to quit: 10/31/2017    Years since quitting: 2.8  . Smokeless tobacco: Never Used  Substance and Sexual Activity  . Alcohol use: No    Comment: wine occassioanlly  . Drug use: Yes    Types: Marijuana  . Sexual activity: Not Currently    Birth control/protection: None  Other Topics Concern  . Not on file  Social History Narrative  . Not on file   Social Determinants of Health   Financial Resource Strain:   . Difficulty of Paying Living Expenses: Not on file  Food Insecurity:   . Worried About Programme researcher, broadcasting/film/video in the Last Year: Not on file  . Ran Out of Food in the Last Year: Not on file  Transportation Needs:   . Lack of Transportation (Medical): Not on file  . Lack of Transportation (Non-Medical): Not on file    Physical Activity:   . Days of Exercise per Week: Not on file  . Minutes of Exercise per Session: Not on file  Stress:   . Feeling of Stress : Not on file  Social Connections:   . Frequency of Communication with Friends and Family: Not on file  . Frequency of Social Gatherings with Friends and Family: Not on file  . Attends Religious Services: Not on file  . Active Member of Clubs or Organizations: Not on file  . Attends Banker Meetings: Not on file  . Marital Status: Not on file  Intimate Partner Violence:   . Fear of Current or Ex-Partner: Not on file  . Emotionally Abused: Not on file  . Physically Abused: Not on file  . Sexually Abused: Not on file  Review of Systems    General:  No chills, fever, night sweats or weight changes.  Cardiovascular:  No chest pain, dyspnea on exertion, edema, orthopnea, palpitations, paroxysmal nocturnal dyspnea. Dermatological: No rash, lesions/masses Respiratory: No cough, dyspnea Urologic: No hematuria, dysuria Abdominal:   No nausea, vomiting, diarrhea, bright red blood per rectum, melena, or hematemesis Neurologic:  No visual changes, wkns, changes in mental status. All other systems reviewed and are otherwise negative except as noted above.  Physical Exam    VS:  BP 118/64   Pulse 66   Ht 5\' 6"  (1.676 m)   Wt 254 lb 12.8 oz (115.6 kg)   LMP 02/01/2020   BMI 41.13 kg/m  , BMI Body mass index is 41.13 kg/m. GEN: Well nourished, well developed, in no acute distress. HEENT: normal. Neck: Supple, no JVD, carotid bruits, or masses. Cardiac: RRR, no murmurs, rubs, or gallops. No clubbing, cyanosis, edema.  Radials/DP/PT 2+ and equal bilaterally.  Respiratory:  Respirations regular and unlabored, clear to auscultation bilaterally. GI: Soft, nontender, nondistended, BS + x 4. MS: no deformity or atrophy. Skin: warm and dry, no rash. Neuro:  Strength and sensation are intact. Psych: Normal affect.  Accessory Clinical  Findings    Recent Labs: 04/03/2020: Hemoglobin 11.3; Platelets 447   Recent Lipid Panel    Component Value Date/Time   CHOL 152 12/04/2013 1251   TRIG 110.0 12/04/2013 1251   HDL 34.20 (L) 12/04/2013 1251   CHOLHDL 4 12/04/2013 1251   VLDL 22.0 12/04/2013 1251   LDLCALC 96 12/04/2013 1251    ECG personally reviewed by me today-normal sinus rhythm 66 bpm no ST or T wave deviation- No acute changes  EKG 03/23/2020 Normal sinus rhythm no ST or T wave deviation 77 bpm    Assessment & Plan   1.  Palpitations-indicates that she has had increased episodes of palpitations over the last few weeks.  Not associated with chest pain, denies presyncope and syncope.  Denies falls. Heart healthy low-sodium diet-salty 6 given Increase physical activity Avoid triggers caffeine, chocolate, EtOH etc. Order 14-day ZIO monitor  Atypical chest pain-no chest pain today.  06/03/2020 presented to [facility] ED with complaints of intermittent dull aching chest discomfort.  Troponins flat.  EKG normal, chest x-ray unremarkable, D-dimer negative.  Appears to be reflux/epigastric in nature. Continue to monitor  GERD-has been intermittently losing Tums to help with GERD symptoms. Order Protonix GERD diet GERD handout given-discussed avoiding laying down after eating, spicy foods, citrus foods, tight clothing, and elevating head of bed. Increase physical activity as tolerated   Disposition: Follow-up with Dr. [facility] or myself in 8 weeks.   Allyson Sabal. Rodolph Hagemann NP-C    08/25/2020, 9:23 AM [facility] [address] Office [phone] Fax [phone]  Notice: This dictation was prepared with Dragon dictation along with smaller phrase technology. Any transcriptional errors that result from this process are unintentional and may not be corrected upon review.

## 2020-08-25 ENCOUNTER — Ambulatory Visit (INDEPENDENT_AMBULATORY_CARE_PROVIDER_SITE_OTHER): Payer: BC Managed Care – PPO | Admitting: General Practice

## 2020-08-25 ENCOUNTER — Encounter: Payer: Self-pay | Admitting: General Practice

## 2020-08-25 ENCOUNTER — Other Ambulatory Visit: Payer: Self-pay

## 2020-08-25 ENCOUNTER — Telehealth: Payer: Self-pay | Admitting: Radiology

## 2020-08-25 VITALS — BP 118/64 | HR 66 | Ht 66.0 in | Wt 254.8 lb

## 2020-08-25 DIAGNOSIS — K219 Gastro-esophageal reflux disease without esophagitis: Secondary | ICD-10-CM

## 2020-08-25 DIAGNOSIS — R0789 Other chest pain: Secondary | ICD-10-CM | POA: Diagnosis not present

## 2020-08-25 DIAGNOSIS — R002 Palpitations: Secondary | ICD-10-CM | POA: Diagnosis not present

## 2020-08-25 MED ORDER — PANTOPRAZOLE SODIUM 40 MG PO TBEC
40.0000 mg | DELAYED_RELEASE_TABLET | Freq: Every day | ORAL | 11 refills | Status: DC
Start: 2020-08-25 — End: 2021-05-16

## 2020-08-25 NOTE — Telephone Encounter (Signed)
Enrolled patient for a 14 day Zio XT  monitor to be mailed to patients home  °

## 2020-08-25 NOTE — Patient Instructions (Addendum)
Medication Instructions:  START PROTONIX 20MG  DAILY *If you need a refill on your cardiac medications before your next appointment, please call your pharmacy*  Testing/Procedures: Your physician has recommended that you wear a 14 DAY ZIO-PATCH monitor. The Zio patch cardiac monitor continuously records heart rhythm data for up to 14 days, this is for patients being evaluated for multiple types heart rhythms. For the first 24 hours post application, please avoid getting the Zio monitor wet in the shower or by excessive sweating during exercise. After that, feel free to carry on with regular activities. Keep soaps and lotions away from the ZIO XT Patch.  This will be mailed to you, please expect 7-10 days to receive.  Special Instructions Please try to avoid these triggers:  Do not use any products that have nicotine or tobacco in them. These include cigarettes, e-cigarettes, and chewing tobacco. If you need help quitting, ask your doctor.  Eat heart-healthy foods. Talk with your doctor about the right eating plan for you.  Exercise regularly as told by your doctor.  Do not drink alcohol, Caffeine or chocolate.  Lose weight if you are overweight.  Do not use drugs, including cannabis   PLEASE READ AND FOLLOW GERD DIET-ATTACHED  PLEASE READ AND FOLLOW SALTY 6-ATTACHED  Follow-Up: Your next appointment:  2 month(s)  In Person with , MD -OR- JESSE CLEAVER, FNP-C  At [facility], you and your health needs are our priority.  As part of our continuing mission to provide you with exceptional heart care, we have created designated Provider Care Teams.  These Care Teams include your primary Cardiologist (physician) and Advanced Practice Providers (APPs -  Physician Assistants and Nurse Practitioners) who all work together to provide you with the care you need, when you need it.        Applying the monitor   Shave hair from upper left chest.   Hold abrader disc by orange  tab.  Rub abrader in 40 strokes over left upper chest as indicated in your monitor instructions.   Clean area with 4 enclosed alcohol pads .  Use all pads to assure are is cleaned thoroughly.  Let dry.   Apply patch as indicated in monitor instructions.  Patch will be place under collarbone on left side of chest with arrow pointing upward.   Rub patch adhesive wings for 2 minutes.Remove white label marked "1".  Remove white label marked "2".  Rub patch adhesive wings for 2 additional minutes.   While looking in a mirror, press and release button in center of patch.  A small green light will flash 3-4 times .  This will be your only indicator the monitor has been turned on.     Do not shower for the first 24 hours.  You may shower after the first 24 hours.   Press button if you feel a symptom. You will hear a small click.  Record Date, Time and Symptom in the Patient Log Book.   When you are ready to remove patch, follow instructions on last 2 pages of Patient Log Book.  Stick patch monitor onto last page of Patient Log Book.   Place Patient Log Book in [place] box.  Use locking tab on box and tape box closed securely.  The [place] and [place] has [employer] on it.  Please place in mailbox as soon as possible.  Your physician should have your test results approximately 7 days after the monitor has been mailed back to [facility].  Call [facility] Customer Care at [phone] if you have questions regarding your ZIO XT patch monitor.  Call them immediately if you see an orange light blinking on your monitor.   If your monitor falls off in less than 4 days contact our Monitor department at [phone].  If your monitor becomes loose or falls off after 4 days call [employer] at [phone] for suggestions on securing your monitor   Food Choices for Gastroesophageal Reflux Disease, Adult When you have gastroesophageal reflux disease (GERD), the foods you eat and your eating habits  are very important. Choosing the right foods can help ease your discomfort. Think about working with a nutrition specialist (dietitian) to help you make good choices. What are tips for following this plan?  Meals  Choose healthy foods that are low in fat, such as fruits, vegetables, whole grains, low-fat dairy products, and lean meat, fish, and poultry.  Eat small meals often instead of 3 large meals a day. Eat your meals slowly, and in a place where you are relaxed. Avoid bending over or lying down until 2-3 hours after eating.  Avoid eating meals 2-3 hours before bed.  Avoid drinking a lot of liquid with meals.  Cook foods using methods other than frying. Bake, grill, or broil food instead.  Avoid or limit: ? Chocolate. ? Peppermint or spearmint. ? Alcohol. ? Pepper. ? Black and decaffeinated coffee. ? Black and decaffeinated tea. ? Bubbly (carbonated) soft drinks. ? Caffeinated energy drinks and soft drinks.  Limit high-fat foods such as: ? Fatty meat or fried foods. ? Whole milk, cream, butter, or ice cream. ? Nuts and nut butters. ? Pastries, donuts, and sweets made with butter or shortening.  Avoid foods that cause symptoms. These foods may be different for everyone. Common foods that cause symptoms include: ? Tomatoes. ? Oranges, lemons, and limes. ? Peppers. ? Spicy food. ? Onions and garlic. ? Vinegar. Lifestyle  Maintain a healthy weight. Ask your doctor what weight is healthy for you. If you need to lose weight, work with your doctor to do so safely.  Exercise for at least 30 minutes for 5 or more days each week, or as told by your doctor.  Wear loose-fitting clothes.  Do not smoke. If you need help quitting, ask your doctor.  Sleep with the head of your bed higher than your feet. Use a wedge under the mattress or blocks under the bed frame to raise the head of the bed. Summary  When you have gastroesophageal reflux disease (GERD), food and lifestyle  choices are very important in easing your symptoms.  Eat small meals often instead of 3 large meals a day. Eat your meals slowly, and in a place where you are relaxed.  Limit high-fat foods such as fatty meat or fried foods.  Avoid bending over or lying down until 2-3 hours after eating.  Avoid peppermint and spearmint, caffeine, alcohol, and chocolate. This information is not intended to replace advice given to you by your health care provider. Make sure you discuss any questions you have with your health care provider. Document Revised: 04/09/2019 Document Reviewed: 01/22/2017 Elsevier Patient Education  2020 [employer].

## 2020-10-12 ENCOUNTER — Encounter: Payer: Self-pay | Admitting: Emergency Medicine

## 2020-10-12 ENCOUNTER — Other Ambulatory Visit: Payer: Self-pay

## 2020-10-12 ENCOUNTER — Emergency Department (INDEPENDENT_AMBULATORY_CARE_PROVIDER_SITE_OTHER)
Admission: EM | Admit: 2020-10-12 | Discharge: 2020-10-12 | Disposition: A | Discharge status: Home / Self Care | Payer: BC Managed Care – PPO | Source: Home / Self Care | Attending: Family Medicine | Admitting: Family Medicine

## 2020-10-12 ENCOUNTER — Encounter: Payer: Self-pay | Admitting: General Practice

## 2020-10-12 DIAGNOSIS — Z20822 Contact with and (suspected) exposure to covid-19: Secondary | ICD-10-CM

## 2020-10-12 NOTE — ED Provider Notes (Signed)
[facility]    CSN: [id number] Arrival date & time: 10/12/20  1514      History   Chief Complaint Chief Complaint  Patient presents with  . Covid Exposure    HPI Bonnie Carr is a 39 y.o. female.   Patient complains of five day history of headache, mild sore throat, fatigue, mild abdominal discomfort, and malaise.  She denies nasal congestion or cough.  She has had COVID exposure and would like to be tested. She has had COVID immunization.  The history is provided by the patient.    Past Medical History:  Diagnosis Date  . GERD (gastroesophageal reflux disease)   . Irregular heart beats   . Kidney stone   . Pre-diabetes   . Trichomonas     Patient Active Problem List   Diagnosis Date Noted  . Complete miscarriage 03/29/2020  . Palpitations 05/07/2018  . Atypical chest pain 05/07/2018  . Obstructive sleep apnea 05/07/2018  . PROM (premature rupture of membranes) 03/15/2018  . NAUSEA AND VOMITING 05/23/2009  . RUQ PAIN 05/17/2009    Past Surgical History:  Procedure Laterality Date  . CERVICAL CERCLAGE N/A 03/14/2018   Procedure: CERCLAGE CERVICAL;  Surgeon: Osborn Coho, MD;  Location: [facility];  Service: Gynecology;  Laterality: N/A;  . CHOLECYSTECTOMY    . HYSTEROSCOPY WITH D & C N/A 08/24/2016   Procedure: DILATATION AND CURETTAGE /HYSTEROSCOPY;  Surgeon: Essie Hart, MD;  Location: [facility];  Service: Gynecology;  Laterality: N/A;  . TONSILLECTOMY      OB History    Gravida  5   Para  1   Term      Preterm  1   AB  3   Living        SAB  1   TAB  2   Ectopic      Multiple      Live Births  1            Home Medications    Prior to Admission medications   Medication Sig Start Date End Date Taking? Authorizing Provider  acetaminophen (TYLENOL) 325 MG tablet Take 650 mg by mouth every 6 (six) hours as needed.   Yes [provider]  cetirizine (ZYRTEC) 10 MG tablet Take 10 mg by mouth daily as needed for  allergies or rhinitis.    Yes [provider]  acetaminophen-codeine (TYLENOL #3) 300-30 MG tablet Take 1-2 tablets by mouth every 6 (six) hours as needed for moderate pain. 03/29/20   Raelyn Mora, CNM  calcium carbonate (TUMS - DOSED IN MG ELEMENTAL CALCIUM) 500 MG chewable tablet Chew 1 tablet by mouth 2 (two) times daily as needed for indigestion or heartburn.    [provider]  ferrous sulfate 325 (65 FE) MG tablet Take 1 tablet (325 mg total) by mouth every other day. 04/03/20 06/02/20  FairHoyle Sauer, MD  pantoprazole (PROTONIX) 40 MG tablet Take 1 tablet (40 mg total) by mouth daily. 08/25/20   Ronney Asters, NP  Prenatal Vit-Fe Fumarate-FA (PRENATAL MULTIVITAMIN) TABS tablet Take 1 tablet by mouth daily at 12 noon.    [provider]  promethazine (PHENERGAN) 12.5 MG tablet Take 1 tablet (12.5 mg total) by mouth every 6 (six) hours as needed for nausea or vomiting. 03/29/20   Raelyn Mora, CNM    Family History Family History  Problem Relation Age of Onset  . Hypertension Mother   . Heart disease Mother   . Diabetes  Father   . Diabetes Maternal Grandmother   . Cancer Maternal Grandmother   . Stroke Maternal Uncle     Social History Social History   Tobacco Use  . Smoking status: Current Some Day Smoker    Packs/day: 0.50    Types: Cigarettes    Last attempt to quit: 10/31/2017    Years since quitting: 2.9  . Smokeless tobacco: Never Used  Substance Use Topics  . Alcohol use: No    Comment: wine occassioanlly  . Drug use: Yes    Types: Marijuana     Allergies   Latex and Other   Review of Systems Review of Systems + sore throat No cough No pleuritic pain No wheezing No nasal congestion No post-nasal drainage No sinus pain/pressure No itchy/red eyes No earache No hemoptysis No SOB No fever/chills No nausea No vomiting + abdominal pain No diarrhea No urinary symptoms No skin rash + fatigue No myalgias No  headache Used OTC meds (Tylenol) without relief   Physical Exam Triage Vital Signs ED Triage Vitals  Enc Vitals Group     BP 10/12/20 1534 (!) 149/93     Pulse Rate 10/12/20 1534 89     Resp 10/12/20 1534 15     Temp 10/12/20 1534 98.9 F (37.2 C)     Temp Source 10/12/20 1534 Oral     SpO2 10/12/20 1534 99 %     Weight --      Height --      Head Circumference --      Peak Flow --      Pain Score 10/12/20 1529 5     Pain Loc --      Pain Edu? --      Excl. in GC? --    No data found.  Updated Vital Signs BP (!) 149/93 (BP Location: Left Arm)   Pulse 89   Temp 98.9 F (37.2 C) (Oral)   Resp 15   LMP 10/06/2020   SpO2 99%   Breastfeeding Unknown   Visual Acuity Right Eye Distance:   Left Eye Distance:   Bilateral Distance:    Right Eye Near:   Left Eye Near:    Bilateral Near:     Physical Exam Nursing notes and Vital Signs reviewed. Appearance:  Patient appears stated age, and in no acute distress Eyes:  Pupils are equal, round, and reactive to light and accomodation.  Extraocular movement is intact.  Conjunctivae are not inflamed  Ears:  Canals normal.  Tympanic membranes normal.  Nose:  Mildly congested turbinates.  No sinus tenderness.  Pharynx:  Normal Neck:  Supple.  Mildly enlarged lateral nodes are present, tender to palpation on the left.   Lungs:  Clear to auscultation.  Breath sounds are equal.  Moving air well. Heart:  Regular rate and rhythm without murmurs, rubs, or gallops.  Abdomen:  Nontender without masses or hepatosplenomegaly.  Bowel sounds are present.  No CVA or flank tenderness.  Extremities:  No edema.  Skin:  No rash present.   UC Treatments / Results  Labs (all labs ordered are listed, but only abnormal results are displayed) Labs Reviewed  NOVEL CORONAVIRUS, NAA    EKG   Radiology No results found.  Procedures Procedures (including critical care time)  Medications Ordered in UC Medications - No data to  display  Initial Impression / Assessment and Plan / UC Course  I have reviewed the triage vital signs and the nursing notes.  Pertinent labs &  imaging results that were available during my care of the patient were reviewed by me and considered in my medical decision making (see chart for details).    Benign exam; suspect early viral syndrome. COVID19 PCR pending. Followup with Family Doctor if not improved in about 10 days.    Final Clinical Impressions(s) / UC Diagnoses   Final diagnoses:  Exposure to COVID-19 virus     Discharge Instructions     Take plain guaifenesin (1200mg  extended release tabs such as Mucinex) twice daily, with plenty of water, for cough and congestion.  May add Pseudoephedrine (30mg , one or two every 4 to 6 hours) for sinus congestion.  Get adequate rest.   May take Delsym Cough Suppressant ("12 Hour Cough Relief") at bedtime for nighttime cough.  Try warm salt water gargles for sore throat.  Stop all antihistamines for now, and other non-prescription cough/cold preparations. May take Ibuprofen 200mg , 4 tabs every 8 hours with food for fever, body aches, etc.  Isolate yourself until COVID-19 test result is available.   If your COVID19 test is positive, then you are infected with the novel coronavirus and could give the virus to others.  Please continue isolation at home for at least 10 days since the start of your symptoms.  Once you complete your 10 day quarantine, you may return to normal activities as long as you've not had a fever for over 24 hours (without taking fever reducing medicine) and your symptoms are improving. Please continue good preventive care measures, including:  frequent hand-washing, avoid touching your face, cover coughs/sneezes, stay out of crowds and keep a 6 foot distance from others.  Go to the nearest hospital emergency room if fever/cough/breathlessness are severe or illness seems like a threat to life.       ED Prescriptions     None        , MD 10/16/20 1137

## 2020-10-12 NOTE — Discharge Instructions (Addendum)
Take plain guaifenesin (1200mg  extended release tabs such as Mucinex) twice daily, with plenty of water, for cough and congestion.  May add Pseudoephedrine (30mg , one or two every 4 to 6 hours) for sinus congestion.  Get adequate rest.   May take Delsym Cough Suppressant ("12 Hour Cough Relief") at bedtime for nighttime cough.  Try warm salt water gargles for sore throat.  Stop all antihistamines for now, and other non-prescription cough/cold preparations. May take Ibuprofen 200mg , 4 tabs every 8 hours with food for fever, body aches, etc.  Isolate yourself until COVID-19 test result is available.   If your COVID19 test is positive, then you are infected with the novel coronavirus and could give the virus to others.  Please continue isolation at home for at least 10 days since the start of your symptoms.  Once you complete your 10 day quarantine, you may return to normal activities as long as you've not had a fever for over 24 hours (without taking fever reducing medicine) and your symptoms are improving. Please continue good preventive care measures, including:  frequent hand-washing, avoid touching your face, cover coughs/sneezes, stay out of crowds and keep a 6 foot distance from others.  Go to the nearest hospital emergency room if fever/cough/breathlessness are severe or illness seems like a threat to life.

## 2020-10-12 NOTE — ED Triage Notes (Addendum)
Patient c/o COVID exposure (last Wednesday).  Patient states she's had malaise, headache, and ABD pain over the weekend but no significant symptoms.   Patient took some Tylenol w/ some relief of symptoms.   Patient wants to rule out COVID-19 status in order to go back to work.

## 2020-10-14 LAB — NOVEL CORONAVIRUS, NAA: SARS-CoV-2, NAA: NOT DETECTED

## 2020-10-14 LAB — SARS-COV-2, NAA 2 DAY TAT

## 2020-10-25 NOTE — Progress Notes (Deleted)
{Choose 1 Note Type (Telehealth Visit or Telephone Visit):[phone]}  Evaluation Performed:  Follow-up visit  This visit type was conducted due to national recommendations for restrictions regarding the COVID-19 Pandemic (e.g. social distancing).  This format is felt to be most appropriate for this patient at this time.  All issues noted in this document were discussed and addressed.  No physical exam was performed (except for noted visual exam findings with Video Visits).  Please refer to the patient's chart (MyChart message for video visits and phone note for telephone visits) for the patient's consent to telehealth for [facility]  Date:  10/25/2020   ID:  Bonnie Carr, DOB 16-Mar-1981, MRN [id number]  Patient Location: *** [address] [facility] [place] [address]   Provider location:     [facility] [address] Office [phone] Fax [phone]   PCP:  Elias Else, MD  Cardiologist:  Nanetta Batty, MD *** Electrophysiologist:  None   Chief Complaint: Follow-up for palpitations  History of Present Illness:    Bonnie Carr is a 39 y.o. female who presents via audio/video conferencing for a telehealth visit today.  Patient verified DOB and address.  She has a PMH of OSA, nausea/vomiting, right upper quadrant pain, premature rupture of membranes, palpitations, atypical chest pain, complete miscarriage.  She presented to the [facility] ED on 06/03/2020 with complaints of left-sided chest pain.  She described pain as achy, and intermittent.  She describes having palpitations earlier today but they have since resolved.  She reported nausea but denied vomiting, diaphoresis, leg pain, lower extremity swelling, shortness of breath, fever, and chills.  Her blood pressure was 118/81 with a heart rate of 84 normal sinus rhythm.  Cardiac troponin normal, D-dimer normal, CXR showed no acute findings.  She contacted nurse triage  line on 08/08/2020 and indicated that she was having frequent palpitations over the last few weeks.  She denied chest pain.  She indicated that when she went to the [facility] ED she was told that she may need a stress test.  She was instructed to keep her follow-up appointment [facility].  She presented the clinic 08/25/2020 for follow-up evaluation and stated she  had intermittent periods of palpitations which caused her to be anxious.  She stated that her mother and father both had heart disease.  She stated that her mother had to have cardioversion as well.  Her palpitations were mainly noticed in the evening at rest.  She had recently begun to increase her physical activity walking 3 miles per day and stated she did not notice any chest pain or palpitations during that time.  She continued to experience mild chest discomfort intermittently that was relieved with rest.   She described the pain as dull and burning sensation which appeared to be related to reflux. She denies exertional chest discomfort.  I  order a 14-day ZIO monitor, started  Protonix, had her continue to increase her physical activity, gave her the GERD diet information and hplanned follow-up in 8 weeks.  Cardiac event monitor results not returned***  She is seen virtually today and states***  Today she denies chest pain, shortness of breath, lower extremity edema, fatigue, palpitations, melena, hematuria, hemoptysis, diaphoresis, weakness, presyncope, syncope, orthopnea, and PND.    The patient {does/does not:200015} symptoms concerning for COVID-19 infection (fever, chills, cough, or new SHORTNESS OF BREATH).    Prior CV studies:   The following studies were reviewed today:  ***  Past Medical History:  Diagnosis Date  . GERD (gastroesophageal reflux disease)   . Irregular heart beats   . Kidney stone   . Pre-diabetes   . Trichomonas    Past Surgical History:  Procedure Laterality Date  . CERVICAL CERCLAGE N/A 03/14/2018    Procedure: CERCLAGE CERVICAL;  Surgeon: Osborn Coho, MD;  Location: [facility];  Service: Gynecology;  Laterality: N/A;  . CHOLECYSTECTOMY    . HYSTEROSCOPY WITH D & C N/A 08/24/2016   Procedure: DILATATION AND CURETTAGE /HYSTEROSCOPY;  Surgeon: Essie Hart, MD;  Location: [facility];  Service: Gynecology;  Laterality: N/A;  . TONSILLECTOMY       No outpatient medications have been marked as taking for the 10/26/20 encounter (Appointment) with Ronney Asters, NP.     Allergies:   Latex and Other   Social History   Tobacco Use  . Smoking status: Current Some Day Smoker    Packs/day: 0.50    Types: Cigarettes    Last attempt to quit: 10/31/2017    Years since quitting: 2.9  . Smokeless tobacco: Never Used  Substance Use Topics  . Alcohol use: No    Comment: wine occassioanlly  . Drug use: Yes    Types: Marijuana     Family Hx: The patient's family history includes Cancer in her maternal grandmother; Diabetes in her father and maternal grandmother; Heart disease in her mother; Hypertension in her mother; Stroke in her maternal uncle.  ROS:   Please see the history of present illness.     All other systems reviewed and are negative.   Labs/Other Tests and Data Reviewed:    Recent Labs: 04/03/2020: Hemoglobin 11.3; Platelets 447   Recent Lipid Panel Lab Results  Component Value Date/Time   CHOL 152 12/04/2013 12:51 PM   TRIG 110.0 12/04/2013 12:51 PM   HDL 34.20 (L) 12/04/2013 12:51 PM   CHOLHDL 4 12/04/2013 12:51 PM   LDLCALC 96 12/04/2013 12:51 PM    Wt Readings from Last 3 Encounters:  08/25/20 254 lb 12.8 oz (115.6 kg)  04/03/20 261 lb 9.6 oz (118.7 kg)  03/28/20 256 lb 12.8 oz (116.5 kg)     Exam:    Vital Signs:  LMP 10/06/2020    Well nourished, well developed female in no  acute distress.   ASSESSMENT & PLAN:    1.  Palpitations-indicates that she has had increased episodes of palpitations over the last few weeks.  Not associated with chest pain, denies  presyncope and syncope.  Denies falls. Heart healthy low-sodium diet-salty 6 given Increase physical activity Avoid triggers caffeine, chocolate, EtOH etc. Order 14-day ZIO monitor  Atypical chest pain-no chest pain today.  06/03/2020 presented to [facility] ED with complaints of intermittent dull aching chest discomfort.  Troponins flat.  EKG normal, chest x-ray unremarkable, D-dimer negative.  Appears to be reflux/epigastric in nature. Continue to monitor  GERD-has been intermittently losing Tums to help with GERD symptoms. Order Protonix GERD diet GERD handout given-discussed avoiding laying down after eating, spicy foods, citrus foods, tight clothing, and elevating head of bed. Increase physical activity as tolerated   Disposition: Follow-up with Dr. Allyson Sabal or myself in   COVID-19 Education: The signs and symptoms of COVID-19 were discussed with the patient and how to seek care for testing (follow up with PCP or arrange E-visit).  The importance of social distancing was discussed today.  Patient Risk:   After full review of this patients clinical status, I feel that they are at  least moderate risk at this time.  Time:   Today, I have spent *** minutes with the patient with telehealth technology discussing ***.     Medication Adjustments/Labs and Tests Ordered: Current medicines are reviewed at length with the patient today.  Concerns regarding medicines are outlined above.   Tests Ordered: No orders of the defined types were placed in this encounter.  Medication Changes: No orders of the defined types were placed in this encounter.   Disposition:  {follow up:15908}  Signed, Thomasene Ripple. Quintavius Niebuhr NP-C    08/04/2019 11:58 AM    [facility] [address] Office [phone] Fax [phone]

## 2020-10-26 ENCOUNTER — Telehealth: Payer: BC Managed Care – PPO | Admitting: General Practice

## 2021-04-13 NOTE — Telephone Encounter (Signed)
Monitor was never returned and marked "lost" in the Zio system. Order will be cancelled 

## 2021-05-16 ENCOUNTER — Ambulatory Visit: Payer: BC Managed Care – PPO

## 2021-05-16 ENCOUNTER — Other Ambulatory Visit: Payer: Self-pay

## 2021-05-16 ENCOUNTER — Ambulatory Visit (INDEPENDENT_AMBULATORY_CARE_PROVIDER_SITE_OTHER): Payer: BC Managed Care – PPO | Admitting: Cardiovascular Disease

## 2021-05-16 ENCOUNTER — Encounter: Payer: Self-pay | Admitting: Cardiovascular Disease

## 2021-05-16 DIAGNOSIS — R002 Palpitations: Secondary | ICD-10-CM | POA: Diagnosis not present

## 2021-05-16 DIAGNOSIS — G4733 Obstructive sleep apnea (adult) (pediatric): Secondary | ICD-10-CM | POA: Diagnosis not present

## 2021-05-16 LAB — HEPATIC FUNCTION PANEL
ALT: 13 IU/L (ref 0–32)
AST: 12 IU/L (ref 0–40)
Albumin: 4.3 g/dL (ref 3.8–4.8)
Alkaline Phosphatase: 83 IU/L (ref 44–121)
Bilirubin Total: <0.2 mg/dL (ref 0.0–1.2)
Bilirubin, Direct: <0.1 mg/dL (ref 0.00–0.40)
Total Protein: 7.5 g/dL (ref 6.0–8.5)

## 2021-05-16 LAB — LIPID PANEL
Chol/HDL Ratio: 3.8 ratio (ref 0.0–4.4)
Cholesterol, Total: 155 mg/dL (ref 100–199)
HDL: 41 mg/dL (ref >39)
LDL Chol Calc (NIH): 79 mg/dL (ref 0–99)
Triglycerides: 208 mg/dL — ABNORMAL HIGH (ref 0–149)
VLDL Cholesterol Cal: 35 mg/dL (ref 5–40)

## 2021-05-16 LAB — TSH+FREE T4
Free T4: 1.07 ng/dL (ref 0.82–1.77)
TSH: 1.62 u[IU]/mL (ref 0.450–4.500)

## 2021-05-16 NOTE — Progress Notes (Signed)
05/16/2021 Bonnie Carr   03/26/1981  [id number]  Primary Physician Elias Else, MD Primary Cardiologist: Runell Gess MD FACP, [place], [place], [place]  HPI:  Bonnie Carr is a 40 y.o.  severely overweight single African-American female who unfortunately lost her child right after birth 03/16/2018.  She was referred to me by Dr. Mora Appl, her OB/GYN, for palpitations.    I last saw her in the office 05/07/2018.  She did see Edd Fabian, FNP 08/25/2020.  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  She says this causes significant anxiety.  She has no cardiac risk factors other than tobacco abuse which she has stopped in the past, currently she smokes marijuana instead. Her mother did have a myocardial infarction at age 70.  She is never had a heart attack or stroke.  She does complain of some atypical chest pain when running on the treadmill.  She had palpitations during her pregnancy which have significantly improved since giving birth .  Since I saw her 3 years ago she continues to have episodic palpitations.  She does work out with a Psychologist, educational and is fairly asymptomatic during these training sessions.  She does have symptoms compatible obstructive sleep apnea.   Current Meds  Medication Sig  . acetaminophen (TYLENOL) 325 MG tablet Take 650 mg by mouth every 6 (six) hours as needed.  . calcium carbonate (TUMS - DOSED IN MG ELEMENTAL CALCIUM) 500 MG chewable tablet Chew 1 tablet by mouth 2 (two) times daily as needed for indigestion or heartburn.     Allergies  Allergen Reactions  . Latex Itching  . Other Hives and Rash    HOUSE DUST, WEED POLLEN    Social History   Socioeconomic History  . Marital status: Single    Spouse name: Not on file  . Number of children: Not on file  . Years of education: Not on file  . Highest education level: Not on file  Occupational History  . Not on file  Tobacco Use  .  Smoking status: Current Some Day Smoker    Packs/day: 0.50    Types: Cigarettes    Last attempt to quit: 10/31/2017    Years since quitting: 3.5  . Smokeless tobacco: Never Used  Substance and Sexual Activity  . Alcohol use: No    Comment: wine occassioanlly  . Drug use: Yes    Types: Marijuana  . Sexual activity: Not Currently    Birth control/protection: None  Other Topics Concern  . Not on file  Social History Narrative  . Not on file   Social Determinants of Health   Financial Resource Strain: Not on file  Food Insecurity: Not on file  Transportation Needs: Not on file  Physical Activity: Not on file  Stress: Not on file  Social Connections: Not on file  Intimate Partner Violence: Not on file     Review of Systems: General: negative for chills, fever, night sweats or weight changes.  Cardiovascular: negative for chest pain, dyspnea on exertion, edema, orthopnea, palpitations, paroxysmal nocturnal dyspnea or shortness of breath Dermatological: negative for rash Respiratory: negative for cough or wheezing Urologic: negative for hematuria Abdominal: negative for nausea, vomiting, diarrhea, bright red blood per rectum, melena, or hematemesis Neurologic: negative for visual changes, syncope, or dizziness All other systems reviewed and are otherwise negative except as noted above.    Blood pressure 128/70, pulse 85, height 5\' 6"  (  1.676 m), weight 266 lb (120.7 kg), SpO2 99 %, unknown if currently breastfeeding.  General appearance: alert and no distress Neck: no adenopathy, no carotid bruit, no JVD, supple, symmetrical, trachea midline and thyroid not enlarged, symmetric, no tenderness/mass/nodules Lungs: clear to auscultation bilaterally Heart: regular rate and rhythm, S1, S2 normal, no murmur, click, rub or gallop Extremities: extremities normal, atraumatic, no cyanosis or edema Pulses: 2+ and symmetric Skin: Skin color, texture, turgor normal. No rashes or  lesions Neurologic: Alert and oriented X 3, normal strength and tone. Normal symmetric reflexes. Normal coordination and gait  EKG sinus rhythm at 85 without ST or T wave changes.  Personally reviewed this EKG.  ASSESSMENT AND PLAN:   Palpitations History of palpitations that occur intermittently intermittently and somewhat related to anxiety.  She apparently tried to wear a Zio patch in the past but it was not adhered to her skin.  We will recheck a Zio patch as well as thyroid function tests.  She does say that she is decreased her caffeine intake.  Obstructive sleep apnea Symptoms compatible with obstructive sleep apnea with nocturnal snoring and daytime somnolence, palpitations and body habitus consistent with this.  We will check a sleep study.  Morbid obesity (HCC) BMI 43.  She is interested in diet and weight loss.  I am referring her to Dr. Dalbert Garnet at the current diet and wellness center for weight management.      Runell Gess MD FACP,FACC,FAHA, [facility] 05/16/2021 8:39 AM

## 2021-05-16 NOTE — Assessment & Plan Note (Signed)
BMI 43.  She is interested in diet and weight loss.  I am referring her to Dr. Dalbert Garnet at the current diet and wellness center for weight management.

## 2021-05-16 NOTE — Assessment & Plan Note (Signed)
History of palpitations that occur intermittently intermittently and somewhat related to anxiety.  She apparently tried to wear a Zio patch in the past but it was not adhered to her skin.  We will recheck a Zio patch as well as thyroid function tests.  She does say that she is decreased her caffeine intake.

## 2021-05-16 NOTE — Progress Notes (Unsigned)
Patient enrolled for [employer] to ship a 14 day ZIO XT long term holter monitor to her home. 

## 2021-05-16 NOTE — Assessment & Plan Note (Signed)
Symptoms compatible with obstructive sleep apnea with nocturnal snoring and daytime somnolence, palpitations and body habitus consistent with this.  We will check a sleep study.

## 2021-05-16 NOTE — Patient Instructions (Signed)
Medication Instructions:  Your physician recommends that you continue on your current medications as directed. Please refer to the Current Medication list given to you today.  *If you need a refill on your cardiac medications before your next appointment, please call your pharmacy*   Lab Work: Your physician recommends that you have labs drawn today: Lipid/liver profile, TSH, Free T4  If you have labs (blood work) drawn today and your tests are completely normal, you will receive your results only by: Marland Kitchen MyChart Message (if you have MyChart) OR . A paper copy in the mail If you have any lab test that is abnormal or we need to change your treatment, we will call you to review the results.   Testing/Procedures: Christena Deem- Long Term Monitor Instructions   Your physician has requested you wear a ZIO patch monitor for 14 days.  This is a single patch monitor.   [employer] supplies one patch monitor per enrollment. Additional stickers are not available. Please do not apply patch if you will be having a Nuclear Stress Test, Echocardiogram, Cardiac CT, MRI, or Chest Xray during the period you would be wearing the monitor. The patch cannot be worn during these tests. You cannot remove and re-apply the ZIO XT patch monitor.  Your ZIO patch monitor will be sent Fed Ex from [employer] directly to your home address. It may take 3-5 days to receive your monitor after you have been enrolled.  Once you have received your monitor, please review the enclosed instructions. Your monitor has already been registered assigning a specific monitor serial # to you.  Billing and Patient Assistance Program Information   We have supplied [employer] with any of your insurance information on file for billing purposes. [employer] offers a sliding scale Patient Assistance Program for patients that do not have insurance, or whose insurance does not completely cover the cost of the ZIO monitor.   You must apply for the Patient  Assistance Program to qualify for this discounted rate.     To apply, please call [employer] at [phone], select option 4, then select option 2, and ask to apply for Patient Assistance Program.  Meredeth Ide will ask your household income, and how many people are in your household.  They will quote your out-of-pocket cost based on that information.  [employer] will also be able to set up a 61-month, interest-free payment plan if needed.  Applying the monitor   Shave hair from upper left chest.  Hold abrader disc by orange tab. Rub abrader in 40 strokes over the upper left chest as indicated in your monitor instructions.  Clean area with 4 enclosed alcohol pads. Let dry.  Apply patch as indicated in monitor instructions. Patch will be placed under collarbone on left side of chest with arrow pointing upward.  Rub patch adhesive wings for 2 minutes. Remove white label marked "1". Remove the white label marked "2". Rub patch adhesive wings for 2 additional minutes.  While looking in a mirror, press and release button in center of patch. A small green light will flash 3-4 times. This will be your only indicator that the monitor has been turned on. ?  Do not shower for the first 24 hours. You may shower after the first 24 hours.  Press the button if you feel a symptom. You will hear a small click. Record Date, Time and Symptom in the Patient Logbook.  When you are ready to remove the patch, follow instructions on the last 2 pages of  the Patient Logbook. Stick patch monitor onto the last page of Patient Logbook.  Place Patient Logbook in the blue and white box.  Use locking tab on box and tape box closed securely.  The blue and white box has prepaid postage on it. Please place it in the mailbox as soon as possible. Your physician should have your test results approximately 7 days after the monitor has been mailed back to [facility]  Call [facility] Customer Care at [phone] if you have questions  regarding your ZIO XT patch monitor. Call them immediately if you see an orange light blinking on your monitor.  If your monitor falls off in less than 4 days, contact our Monitor department at [phone]. ?If your monitor becomes loose or falls off after 4 days call [employer] at [phone] for suggestions on securing your monitor.?  Your physician has recommended that you have a sleep study. This test records several body functions during sleep, including: brain activity, eye movement, oxygen and carbon dioxide blood levels, heart rate and rhythm, breathing rate and rhythm, the flow of air through your mouth and nose, snoring, body muscle movements, and chest and belly movement.   Follow-Up: At [facility], you and your health needs are our priority.  As part of our continuing mission to provide you with exceptional heart care, we have created designated Provider Care Teams.  These Care Teams include your primary Cardiologist (physician) and Advanced Practice Providers (APPs -  Physician Assistants and Nurse Practitioners) who all work together to provide you with the care you need, when you need it.  We recommend signing up for the patient portal called "MyChart".  Sign up information is provided on this After Visit Summary.  MyChart is used to connect with patients for Virtual Visits (Telemedicine).  Patients are able to view lab/test results, encounter notes, upcoming appointments, etc.  Non-urgent messages can be sent to your provider as well.   To learn more about what you can do with MyChart, go to [email or web address].    Your next appointment:   6 month(s)  The format for your next appointment:   In Person  Provider:   You will see one of the following Advanced Practice Providers on your designated Care Team:    Edd Fabian, FNP  Then, Nanetta Batty, MD will plan to see you again in 12 month(s).

## 2021-06-13 ENCOUNTER — Other Ambulatory Visit: Payer: Self-pay | Admitting: Cardiovascular Disease

## 2021-06-13 ENCOUNTER — Telehealth: Payer: Self-pay | Admitting: *Deleted

## 2021-06-13 DIAGNOSIS — R0683 Snoring: Secondary | ICD-10-CM

## 2021-06-13 DIAGNOSIS — R400 Somnolence: Secondary | ICD-10-CM

## 2021-06-13 NOTE — Telephone Encounter (Signed)
Patient notified of HST appointment. BC[employer] Auth # [id number]. Valid dates 06/13/21 to 08/11/21.

## 2021-06-16 ENCOUNTER — Encounter (INDEPENDENT_AMBULATORY_CARE_PROVIDER_SITE_OTHER): Payer: Self-pay

## 2021-07-21 ENCOUNTER — Ambulatory Visit (HOSPITAL_BASED_OUTPATIENT_CLINIC_OR_DEPARTMENT_OTHER): Payer: BC Managed Care – PPO | Attending: Cardiovascular Disease | Admitting: Cardiovascular Disease

## 2021-07-21 ENCOUNTER — Other Ambulatory Visit: Payer: Self-pay

## 2021-07-21 DIAGNOSIS — R400 Somnolence: Secondary | ICD-10-CM

## 2021-07-21 DIAGNOSIS — G4733 Obstructive sleep apnea (adult) (pediatric): Secondary | ICD-10-CM | POA: Diagnosis not present

## 2021-07-21 DIAGNOSIS — Z6841 Body Mass Index (BMI) 40.0 and over, adult: Secondary | ICD-10-CM | POA: Diagnosis not present

## 2021-07-21 DIAGNOSIS — R0683 Snoring: Secondary | ICD-10-CM

## 2021-08-01 ENCOUNTER — Encounter (HOSPITAL_BASED_OUTPATIENT_CLINIC_OR_DEPARTMENT_OTHER): Payer: Self-pay | Admitting: Cardiovascular Disease

## 2021-08-01 NOTE — Procedures (Signed)
       Patient Name: Bonnie Carr, Bonnie Carr Date: 07/24/2021 Gender: Female D.O.B: 01-18-1981 Age (years): 40 Referring Provider: Runell Gess Height (inches): 66 Interpreting Physician: Nicki Guadalajara MD, ABSM Weight (lbs): 256 RPSGT: [place] BMI: 41 MRN: [id number] Neck Size: 15.25  CLINICAL INFORMATION Sleep Study Type: HST  Indication for sleep study: snoring, daytime somnolence, morbid obesity.  Epworth Sleepiness Score: 7  SLEEP STUDY TECHNIQUE A multi-channel overnight portable sleep study was performed. The channels recorded were: nasal airflow, thoracic respiratory movement, and oxygen saturation with a pulse oximetry. Snoring was also monitored.  MEDICATIONS acetaminophen (TYLENOL) 325 MG tablet calcium carbonate (TUMS - DOSED IN MG ELEMENTAL CALCIUM) 500 MG chewable tablet  Patient self administered medications include: N/A.  SLEEP ARCHITECTURE Patient was studied for 363.7 minutes. The sleep efficiency was 100.0 % and the patient was supine for 0%. The arousal index was 0.0 per hour.  RESPIRATORY PARAMETERS The overall AHI was 22.8 per hour, with a central apnea index of 0 per hour. Supine sleep was not present on this study. The severity during REM sleep cannot be assessed on this home study.  The oxygen nadir was 87% during sleep.  CARDIAC DATA Mean heart rate during sleep was 74.2 bpm.  IMPRESSIONS - Moderate obstructive sleep apnea occurred during this study (AHI  22.8/h); however, supine sleep was absent during this study. - Mild oxygen desaturation to a nadir of 87%. - Patient snored 7.7% during the sleep.  DIAGNOSIS - Obstructive Sleep Apnea (G47.33)  RECOMMENDATIONS - Recommend CPAP titration to determine optimal pressure for her sleep disordered breathing. Can consider AutoPAP trial with EPR of 3 at  6- 18 cm of water.  - Effort should be made to optimize nasal and oropharyngeal patency.  - Avoid alcohol, sedatives and other  CNS depressants that may worsen sleep apnea and disrupt normal sleep architecture. - Sleep hygiene should be reviewed to assess factors that may improve sleep quality. - Weight management and regular exercise should be initiated or continued. - Recommend a download and sleep clinic evaluation after one month of therapy.   [Electronically signed] 08/01/2021 04:14 PM  Nicki Guadalajara MD, [facility], ABSM Diplomate, American Board of Sleep Medicine   NPI: [id number] [facility] PH: [phone]   FX: [phone] ACCREDITED BY THE AMERICAN ACADEMY OF SLEEP MEDICINE

## 2021-08-10 ENCOUNTER — Telehealth: Payer: Self-pay | Admitting: *Deleted

## 2021-08-10 NOTE — Telephone Encounter (Signed)
-----   Message from Lennette Bihari, MD sent at 08/01/2021  4:20 PM EDT ----- Burna Mortimer, please notify pt or results;  CPAP titration recommended,  but can initiate Auto-PAP

## 2021-08-10 NOTE — Telephone Encounter (Signed)
Patient notified of HST results and recommendations. She agrees to proceed with treatment. BCBS denied titration. No Co morbidities. APAP ordered and sent to [facility].

## 2021-08-30 ENCOUNTER — Encounter (INDEPENDENT_AMBULATORY_CARE_PROVIDER_SITE_OTHER): Payer: Self-pay

## 2022-05-23 ENCOUNTER — Ambulatory Visit (INDEPENDENT_AMBULATORY_CARE_PROVIDER_SITE_OTHER): Payer: BC Managed Care – PPO | Admitting: Cardiovascular Disease

## 2022-05-23 ENCOUNTER — Encounter: Payer: Self-pay | Admitting: Cardiovascular Disease

## 2022-05-23 DIAGNOSIS — I480 Paroxysmal atrial fibrillation: Secondary | ICD-10-CM

## 2022-05-23 DIAGNOSIS — G4733 Obstructive sleep apnea (adult) (pediatric): Secondary | ICD-10-CM

## 2022-05-23 NOTE — Patient Instructions (Signed)
Medication Instructions:  ?Your physician recommends that you continue on your current medications as directed. Please refer to the Current Medication list given to you today. ? ?*If you need a refill on your cardiac medications before your next appointment, please call your pharmacy* ? ? ?Follow-Up: ?At [facility], you and your health needs are our priority.  As part of our continuing mission to provide you with exceptional heart care, we have created designated Provider Care Teams.  These Care Teams include your primary Cardiologist (physician) and Advanced Practice Providers (APPs -  Physician Assistants and Nurse Practitioners) who all work together to provide you with the care you need, when you need it. ? ?We recommend signing up for the patient portal called "MyChart".  Sign up information is provided on this After Visit Summary.  MyChart is used to connect with patients for Virtual Visits (Telemedicine).  Patients are able to view lab/test results, encounter notes, upcoming appointments, etc.  Non-urgent messages can be sent to your provider as well.   ?To learn more about what you can do with MyChart, go to [email or web address].   ? ?Your next appointment:   ?6 month(s) ? ?The format for your next appointment:   ?In Person ? ?Provider:   ?Jesse Cleaver, FNP, Angela Duke, PA-C, Callie Goodrich, PA-C, Jennifer, Lambert, PA-C, Kathryn Lawrence, DNP, ANP, Hao Meng, PA-C, or Emily Monge, NP     ? ? ?Then, Jonathan Berry, MD will plan to see you again in 12 month(s). ?

## 2022-05-23 NOTE — Progress Notes (Signed)
05/23/2022 Bonnie Carr   14-Jun-1981  [id number]  Primary Physician Maury Dus, MD Primary Cardiologist: Lorretta Harp MD FACP, [place], [place], [place]  HPI:  Bonnie Carr is a 41 y.o.  severely overweight single African-American female who unfortunately lost her child right after birth 03/16/2018.  She was referred to me by Dr. Alwyn Pea, her OB/GYN, for palpitations.    I last saw her in the office 05/16/2021.  She did see Coletta Memos, FNP 08/25/2020.  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  She says this causes significant anxiety.  She has no cardiac risk factors other than tobacco abuse which she has stopped in the past, currently she smokes marijuana instead. Her mother did have a myocardial infarction at age 50.  She is never had a heart attack or stroke.  She does complain of some atypical chest pain when running on the treadmill.  She had palpitations during her pregnancy which have significantly improved since giving birth .   Since I saw her a year ago she was admitted to the hospital at [facility] on 5/16 for 1 night.  She had PAF with RVR and was pharmacologically converted.  She was started on Cardizem.  She did have a positive sleep study and was prescribed CPAP but unfortunately never started this.   Current Meds  Medication Sig   acetaminophen (TYLENOL) 325 MG tablet Take 650 mg by mouth every 6 (six) hours as needed.   calcium carbonate (TUMS - DOSED IN MG ELEMENTAL CALCIUM) 500 MG chewable tablet Chew 1 tablet by mouth 2 (two) times daily as needed for indigestion or heartburn.   diltiazem (CARDIZEM LA) 120 MG 24 hr tablet Take by mouth.     Allergies  Allergen Reactions   Latex Itching   Other Hives and Rash    HOUSE DUST, WEED POLLEN    Social History   Socioeconomic History   Marital status: Single    Spouse name: Not on file   Number of children: Not on file   Years of  education: Not on file   Highest education level: Not on file  Occupational History   Not on file  Tobacco Use   Smoking status: Some Days    Packs/day: 0.50    Types: Cigarettes    Last attempt to quit: 10/31/2017    Years since quitting: 4.5   Smokeless tobacco: Never  Substance and Sexual Activity   Alcohol use: No    Comment: wine occassioanlly   Drug use: Yes    Types: Marijuana   Sexual activity: Not Currently    Birth control/protection: None  Other Topics Concern   Not on file  Social History Narrative   Not on file   Social Determinants of Health   Financial Resource Strain: Not on file  Food Insecurity: Not on file  Transportation Needs: Not on file  Physical Activity: Not on file  Stress: Not on file  Social Connections: Not on file  Intimate Partner Violence: Not on file     Review of Systems: General: negative for chills, fever, night sweats or weight changes.  Cardiovascular: negative for chest pain, dyspnea on exertion, edema, orthopnea, palpitations, paroxysmal nocturnal dyspnea or shortness of breath Dermatological: negative for rash Respiratory: negative for cough or wheezing Urologic: negative for hematuria Abdominal: negative for nausea, vomiting, diarrhea, bright red blood per rectum, melena, or hematemesis Neurologic: negative for  visual changes, syncope, or dizziness All other systems reviewed and are otherwise negative except as noted above.    Blood pressure 100/76, pulse 66, height 5\' 6"  (1.676 m), weight 272 lb 3.2 oz (123.5 kg), SpO2 99 %, unknown if currently breastfeeding.  General appearance: alert and no distress Neck: no adenopathy, no carotid bruit, no JVD, supple, symmetrical, trachea midline, and thyroid not enlarged, symmetric, no tenderness/mass/nodules Lungs: clear to auscultation bilaterally Heart: regular rate and rhythm, S1, S2 normal, no murmur, click, rub or gallop Extremities: extremities normal, atraumatic, no cyanosis or  edema Pulses: 2+ and symmetric Skin: Skin color, texture, turgor normal. No rashes or lesions Neurologic: Grossly normal  EKG sinus rhythm at 66 without ST or T wave changes.  Personally reviewed this EKG.  ASSESSMENT AND PLAN:   Obstructive sleep apnea History of obstructive sleep apnea with a positive sleep study.  Apparently sleep CPAP was ordered but she has not received this.  We will try to expedite.  Morbid obesity ([place]) History of morbid obesity with a BMI of 44.  I did refer her to [facility] last year but unfortunately this never occurred.  We will reconsult  PAF (paroxysmal atrial fibrillation) [facility]) Recent admission for PAF at [facility] on 05/15/2022 overnight.  She converted with pharmacologic therapy.  She is currently in sinus rhythm. The CHA2DSVASC2 score is 1  .  They appropriately did not put her on an oral anticoagulant.  2D echo was normal.  They placed her on p.o. Cardizem.  I told her to avoid caffeinated beverages and stimulants.  We will arrange for her to obtain a CPAP machine for her obstructive sleep apnea.  She will see an APP back in 6 months and me back in 1 year.     Lorretta Harp MD FACP,FACC,FAHA, [facility] 05/23/2022 12:41 PM

## 2022-05-23 NOTE — Assessment & Plan Note (Signed)
History of obstructive sleep apnea with a positive sleep study.  Apparently sleep CPAP was ordered but she has not received this.  We will try to expedite.

## 2022-05-23 NOTE — Assessment & Plan Note (Signed)
History of morbid obesity with a BMI of 44.  I did refer her to [facility] last year but unfortunately this never occurred.  We will reconsult

## 2022-05-23 NOTE — Assessment & Plan Note (Signed)
Recent admission for PAF at [facility] on 05/15/2022 overnight.  She converted with pharmacologic therapy.  She is currently in sinus rhythm. The CHA2DSVASC2 score is 1  .  They appropriately did not put her on an oral anticoagulant.  2D echo was normal.  They placed her on p.o. Cardizem.  I told her to avoid caffeinated beverages and stimulants.  We will arrange for her to obtain a CPAP machine for her obstructive sleep apnea.  She will see an APP back in 6 months and me back in 1 year.

## 2022-06-11 ENCOUNTER — Encounter: Payer: Self-pay | Admitting: Cardiovascular Disease

## 2022-06-14 ENCOUNTER — Telehealth: Payer: Self-pay

## 2022-06-14 NOTE — Telephone Encounter (Signed)
Called pt to verify her message. She states her blood pressure cuff does show the reading but it does not store it. I advised her to write down the readings when she takes her blood pressure. I let her know usually the blood pressure cuff does show the heart rate (pulse) she verbalized understanding and will start to record her readings. She is aware her message was sent to Dr. Allyson Sabal for review.

## 2022-07-02 ENCOUNTER — Telehealth: Payer: Self-pay | Admitting: Cardiovascular Disease

## 2022-07-02 ENCOUNTER — Other Ambulatory Visit: Payer: Self-pay

## 2022-07-02 MED ORDER — DILTIAZEM HCL ER 120 MG PO TB24: 120.0000 mg | ORAL_TABLET | Freq: Every day | ORAL | 3 refills | Status: DC

## 2022-07-02 NOTE — Telephone Encounter (Signed)
*  STAT* If patient is at the pharmacy, call can be transferred to refill team.   1. Which medications need to be refilled? (please list name of each medication and dose if known) diltiazem (CARDIZEM LA) 120 MG 24 hr tablet  2. Which pharmacy/location (including street and city if local pharmacy) is medication to be sent to? [facility] [address] [place] - [address]  3. Do they need a 30 day or 90 day supply? 90 day  Patient is completely out of medication

## 2022-07-02 NOTE — Progress Notes (Signed)
Medication reordered and sent to corrected pharmacy

## 2022-07-17 ENCOUNTER — Emergency Department (HOSPITAL_BASED_OUTPATIENT_CLINIC_OR_DEPARTMENT_OTHER): Payer: BC Managed Care – PPO | Admitting: Radiology

## 2022-07-17 ENCOUNTER — Other Ambulatory Visit: Payer: Self-pay

## 2022-07-17 ENCOUNTER — Encounter (HOSPITAL_BASED_OUTPATIENT_CLINIC_OR_DEPARTMENT_OTHER): Payer: Self-pay

## 2022-07-17 DIAGNOSIS — Z91040 Latex allergy status: Secondary | ICD-10-CM | POA: Insufficient documentation

## 2022-07-17 DIAGNOSIS — R42 Dizziness and giddiness: Secondary | ICD-10-CM | POA: Insufficient documentation

## 2022-07-17 LAB — CBC
HCT: 37 % (ref 36.0–46.0)
Hemoglobin: 12 g/dL (ref 12.0–15.0)
MCH: 27.8 pg (ref 26.0–34.0)
MCHC: 32.4 g/dL (ref 30.0–36.0)
MCV: 85.6 fL (ref 80.0–100.0)
Platelets: 416 10*3/uL — ABNORMAL HIGH (ref 150–400)
RBC: 4.32 MIL/uL (ref 3.87–5.11)
RDW: 14.6 % (ref 11.5–15.5)
WBC: 6.3 10*3/uL (ref 4.0–10.5)
nRBC: 0 % (ref 0.0–0.2)

## 2022-07-17 LAB — BASIC METABOLIC PANEL
Anion gap: 9 (ref 5–15)
BUN: 9 mg/dL (ref 6–20)
CO2: 25 mmol/L (ref 22–32)
Calcium: 9.6 mg/dL (ref 8.9–10.3)
Chloride: 103 mmol/L (ref 98–111)
Creatinine, Ser: 0.91 mg/dL (ref 0.44–1.00)
GFR, Estimated: >60 mL/min (ref >60)
Glucose, Bld: 88 mg/dL (ref 70–99)
Potassium: 3.6 mmol/L (ref 3.5–5.1)
Sodium: 137 mmol/L (ref 135–145)

## 2022-07-17 LAB — HCG, SERUM, QUALITATIVE: Preg, Serum: NEGATIVE

## 2022-07-17 LAB — TROPONIN I (HIGH SENSITIVITY): Troponin I (High Sensitivity): <2 ng/L (ref <18)

## 2022-07-17 NOTE — ED Triage Notes (Signed)
Patient here POV from Home.  Endorses Dizziness episodes Intermittently for 1 Week.  No SOB. Tightness in Chest at Times. No Known Fevers.    Recently Diagnosed with Atrial Fibrillation and placed. BP at Home have been 130 Systolic. Placed on Cardizem. No Anticoagulants.   NAD Noted during Triage. A&Ox4. GCS 15. Ambulatory.

## 2022-07-18 ENCOUNTER — Emergency Department (HOSPITAL_BASED_OUTPATIENT_CLINIC_OR_DEPARTMENT_OTHER)
Admission: EM | Admit: 2022-07-18 | Discharge: 2022-07-18 | Disposition: A | Discharge status: Home / Self Care | Payer: BC Managed Care – PPO | Attending: Emergency Medicine | Admitting: Emergency Medicine

## 2022-07-18 DIAGNOSIS — R42 Dizziness and giddiness: Secondary | ICD-10-CM

## 2022-07-18 MED ORDER — MECLIZINE HCL 25 MG PO TABS: 25.0000 mg | ORAL_TABLET | Freq: Three times a day (TID) | ORAL | 0 refills | Status: DC | PRN

## 2022-07-18 NOTE — Discharge Instructions (Addendum)
Begin taking meclizine as prescribed.  Follow-up with your primary doctor and cardiologist in the next week, and return to the ER if symptoms significantly worsen or change.

## 2022-07-18 NOTE — ED Provider Notes (Signed)
[facility] EMERGENCY DEPT Provider Note   CSN: [id number] Arrival date & time: 07/17/22  1946     History  Chief Complaint  Patient presents with   Dizziness    Bonnie Carr is a 41 y.o. female.  Patient is a 41 year old female with past medical history of recently diagnosed paroxysmal atrial fibrillation.  She was started last month on Cardizem.  She presents today with complaints of dizziness.  This has been occurring episodically for the past week.  She describes episodes where she becomes off balance and room spinning type sensation.  This comes and goes at random with no aggravating or alleviating factors.  She denies to me she is having any headaches, weakness, numbness, visual disturbances.  She denies any ringing in her ears or hearing loss.  The history is provided by the patient.       Home Medications Prior to Admission medications   Medication Sig Start Date End Date Taking? Authorizing Provider  acetaminophen (TYLENOL) 325 MG tablet Take 650 mg by mouth every 6 (six) hours as needed.    [provider]  calcium carbonate (TUMS - DOSED IN MG ELEMENTAL CALCIUM) 500 MG chewable tablet Chew 1 tablet by mouth 2 (two) times daily as needed for indigestion or heartburn.    [provider]  diltiazem (CARDIZEM LA) 120 MG 24 hr tablet Take 1 tablet (120 mg total) by mouth daily. 07/02/22   Runell Gess, MD      Allergies    Latex and Other    Review of Systems   Review of Systems  All other systems reviewed and are negative.   Physical Exam Updated Vital Signs BP 118/79   Pulse 72   Temp 97.8 F (36.6 C)   Resp (!) 24   Ht 5\' 6"  (1.676 m)   Wt 123.5 kg   SpO2 97%   BMI 43.95 kg/m  Physical Exam Vitals and nursing note reviewed.  Constitutional:      General: She is not in acute distress.    Appearance: She is well-developed. She is not diaphoretic.  HENT:     Head: Normocephalic and atraumatic.  Eyes:      Extraocular Movements: Extraocular movements intact.     Pupils: Pupils are equal, round, and reactive to light.  Cardiovascular:     Rate and Rhythm: Normal rate and regular rhythm.     Heart sounds: No murmur heard.    No friction rub. No gallop.  Pulmonary:     Effort: Pulmonary effort is normal. No respiratory distress.     Breath sounds: Normal breath sounds. No wheezing.  Abdominal:     General: Bowel sounds are normal. There is no distension.     Palpations: Abdomen is soft.     Tenderness: There is no abdominal tenderness.  Musculoskeletal:        General: Normal range of motion.     Cervical back: Normal range of motion and neck supple.  Skin:    General: Skin is warm and dry.  Neurological:     General: No focal deficit present.     Mental Status: She is alert and oriented to person, place, and time.     Cranial Nerves: No cranial nerve deficit.     Sensory: No sensory deficit.     Motor: No weakness.     Coordination: Coordination normal.     ED Results / Procedures / Treatments   Labs (all labs ordered are listed,  but only abnormal results are displayed) Labs Reviewed  CBC - Abnormal; Notable for the following components:      Result Value   Platelets 416 (*)    All other components within normal limits  BASIC METABOLIC PANEL  HCG, SERUM, QUALITATIVE  TROPONIN I (HIGH SENSITIVITY)    EKG None  Radiology DG Chest 2 View  Result Date: 07/17/2022 CLINICAL DATA:  Chest pain EXAM: CHEST - 2 VIEW COMPARISON:  03/15/2018 FINDINGS: The heart size and mediastinal contours are within normal limits. Both lungs are clear. The visualized skeletal structures are unremarkable. IMPRESSION: No active cardiopulmonary disease. Electronically Signed   By: Helyn Numbers M.D.   On: 07/17/2022 21:02    Procedures Procedures    Medications Ordered in ED Medications - No data to display  ED Course/ Medical Decision Making/ A&P  This patient presents to the ED for concern  of dizziness, this involves an extensive number of treatment options, and is a complaint that carries with it a high risk of complications and morbidity.  The differential diagnosis includes vertigo, medication side effect, CVA, anemia   Co morbidities that complicate the patient evaluation  None   Additional history obtained:  No additional history or external records needed   Lab Tests:  I Ordered, and personally interpreted labs.  The pertinent results include: Unremarkable CBC, metabolic panel, and troponin   Imaging Studies ordered:  I ordered imaging studies including chest X ray I independently visualized and interpreted imaging which showed no acute process I agree with the radiologist interpretation   Cardiac Monitoring: / EKG:  The patient was maintained on a cardiac monitor.  I personally viewed and interpreted the cardiac monitored which showed an underlying rhythm of: Sinus rhythm   Consultations Obtained:  No emergent consultations indicated   Problem List / ED Course / Critical interventions / Medication management  Patient presenting with complaints of dizziness that I suspect is peripheral vertigo.  This seems to come and go and causes her a spinning sensation.  She is neurologically intact with stable vital signs and unremarkable work-up.  I feels the patient can safely be discharged with meclizine and as needed follow-up.  She was also recently started on Cardizem and this could potentially be a medication side effect, but patient is reluctant to stop this medication. I have reviewed the patients home medicines and have made adjustments as needed   Social Determinants of Health:  None   Test / Admission - Considered:  No emergent pathology identified or indication for admission  Final Clinical Impression(s) / ED Diagnoses Final diagnoses:  None    Rx / DC Orders ED Discharge Orders     None         Geoffery Lyons, MD 07/18/22 [phone]

## 2022-08-08 ENCOUNTER — Encounter (INDEPENDENT_AMBULATORY_CARE_PROVIDER_SITE_OTHER): Payer: Self-pay

## 2022-09-12 ENCOUNTER — Telehealth: Payer: Self-pay | Admitting: *Deleted

## 2022-09-12 ENCOUNTER — Telehealth: Payer: Self-pay | Admitting: Cardiovascular Disease

## 2022-09-12 NOTE — Telephone Encounter (Signed)
[facility] pharmacy they were calling to report patient has been taking Diltiazem LA 120 mg capsules instead of tablets.Advised ok to continue. I will change in her chart.

## 2022-09-12 NOTE — Telephone Encounter (Signed)
Pt c/o medication issue:  1. Name of Medication: diltiazem (CARDIZEM LA) 120 MG 24 hr tablet  2. How are you currently taking this medication (dosage and times per day)?   3. Are you having a reaction (difficulty breathing--STAT)? No  4. What is your medication issue? Pharmacy is calling because they have been giving the patient the capsule form of this medication and not the tablet. Pharmacy would like call back to discuss if this is okay to continue.Please advise.

## 2022-09-12 NOTE — Telephone Encounter (Signed)
Received a e- mail from the patient stating she has not yet received her CPAP machine. She was informed per Jasmine December with [facility] that she was left 3 messages to return a call to come in for set up. Per Jasmine December the messages were left on 11/20/21, 12/08/21 and again on 12/21/21. She was advised via return e-mail to contact [facility] to arrange a time to come in to proceed with being set up on CPAP.

## 2022-09-18 ENCOUNTER — Encounter (INDEPENDENT_AMBULATORY_CARE_PROVIDER_SITE_OTHER): Payer: Self-pay | Admitting: Internal Medicine

## 2022-09-27 ENCOUNTER — Ambulatory Visit (INDEPENDENT_AMBULATORY_CARE_PROVIDER_SITE_OTHER): Payer: Self-pay | Admitting: Internal Medicine

## 2022-09-27 ENCOUNTER — Encounter (INDEPENDENT_AMBULATORY_CARE_PROVIDER_SITE_OTHER): Payer: Self-pay | Admitting: Internal Medicine

## 2022-09-27 VITALS — BP 117/81 | HR 72 | Temp 98.5°F | Ht 66.0 in | Wt 268.0 lb

## 2022-09-27 DIAGNOSIS — G4733 Obstructive sleep apnea (adult) (pediatric): Secondary | ICD-10-CM

## 2022-09-27 DIAGNOSIS — I480 Paroxysmal atrial fibrillation: Secondary | ICD-10-CM

## 2022-09-27 DIAGNOSIS — Z0289 Encounter for other administrative examinations: Secondary | ICD-10-CM

## 2022-09-27 DIAGNOSIS — Z6841 Body Mass Index (BMI) 40.0 and over, adult: Secondary | ICD-10-CM

## 2022-09-27 NOTE — Progress Notes (Signed)
Office: [phone]  /  Fax: [phone]  Initial Visit  Bonnie Carr was seen in clinic today to evaluate for obesity. She is interested in losing weight to improve overall health and reduce the risk of weight related complications.  She was referred by cardiologist.  It was felt that weight loss may help her with her atrial fibrillation and obstructive sleep apnea.  She reports having problems with depressed mood in the past as she lost multiple family members including her son, mother and grandmother during a short period of time.  She gained weight during this time.  She exercises on and off.  Her peak weight is 268 pounds.  She does not remember her high school weight but was very athletic and participated in multiple sports.  Her goal is to reach a healthier weight and prevent weight related complications.  She is currently not following a nutritional plan or engaging in regular physical activity.  She presents today to review program treatment options, initial physical assessment, and evaluation.      Past medical history includes:   Past Medical History:  Diagnosis Date   GERD (gastroesophageal reflux disease)    Irregular heart beats    Kidney stone    Pre-diabetes    Trichomonas      Objective:   BP 117/81   Pulse 72   Temp 98.5 F (36.9 C)   Ht 5\' 6"  (1.676 m)   Wt 268 lb (121.6 kg)   SpO2 100%   BMI 43.26 kg/m  She was weighed on the bioimpedance scale:  Body mass index is 43.26 kg/m.  General:  Alert, oriented and cooperative. Patient is in no acute distress.  Respiratory: Normal respiratory effort, no problems with respiration noted  Extremities: Normal range of motion.    Mental Status: Normal mood and affect. Normal behavior. Normal judgment and thought content.   Assessment and Plan:   PAF (paroxysmal atrial fibrillation) (HCC) Rate controlled on diltiazem.  She is not on anticoagulation possibly due to low Chadvasc.  Currently asymptomatic.  Continue  calcium channel blocker.  Class 3 severe obesity with serious comorbidity and body mass index (BMI) of 40.0 to 44.9 in adult, unspecified obesity type [facility])  Patient counseled on weight, associated conditions and contributing factors.  She would benefit from a high-protein, reduced calorie nutritional plan in addition to initiating physical activity.   Obstructive sleep apnea Patient reports mild.  She currently is on a wait list for CPAP machine.  We discussed the risk associated with sleep apnea as well as the benefits of losing 10 to 15% of body weight.      Obesity Treatment Plan:  She will work on garnering support from family and friends to begin weight loss journey. Work on eliminating or reducing the presence of highly processed, calorie dense foods in the home. Complete provided nutritional and psychosocial assessment questionnaire.    [facility] will follow up in the next 1-2 weeks to review the above steps and continue evaluation and treatment.  Obesity Education Performed Today:  She was weighed on the bioimpedance scale and results were discussed and documented in the synopsis.  We discussed obesity as a disease and the importance of a more detailed evaluation of all the factors contributing to the disease.  We discussed the importance of long term lifestyle changes which include nutrition, exercise and behavioral modifications as well as the importance of customizing this to her specific health and social needs.  We discussed the  benefits of reaching a healthier weight to alleviate the symptoms of existing conditions and reduce the risks of the biomechanical, metabolic and psychological effects of obesity.  We discussed the goals of this program is to improve her overall health and not simply achieve a specific BMI.  Frequent visits are very important to patient success. I plan to see her every 2 weeks for the first 3 months and then evaluate the visit frequency  after that time. I explained obesity is a life-long chronic disease and long term treatments would be required. Medications to help her follow his eating plan may be offered as appropriate but are not required. All medication decisions will be made together after the initial workup is done and benefits and side effects are discussed in depth.  The clinic rules were reviewed including the late policy, cancellation policy, no show and program fees.  Jobie Quaker appears to be in the action stage of change and states they are ready to start intensive lifestyle modifications and behavioral modifications.  30 minutes was spent today on this visit including the above counseling, pre-visit chart review, and post-visit documentation.  Thomes Dinning, MD

## 2023-01-31 ENCOUNTER — Encounter (HOSPITAL_BASED_OUTPATIENT_CLINIC_OR_DEPARTMENT_OTHER): Payer: Self-pay

## 2023-01-31 ENCOUNTER — Other Ambulatory Visit: Payer: Self-pay

## 2023-01-31 ENCOUNTER — Emergency Department (HOSPITAL_BASED_OUTPATIENT_CLINIC_OR_DEPARTMENT_OTHER)
Admission: EM | Admit: 2023-01-31 | Discharge: 2023-02-01 | Disposition: A | Discharge status: Home / Self Care | Payer: BC Managed Care – PPO | Attending: Emergency Medicine | Admitting: Emergency Medicine

## 2023-01-31 ENCOUNTER — Emergency Department (HOSPITAL_BASED_OUTPATIENT_CLINIC_OR_DEPARTMENT_OTHER): Payer: BC Managed Care – PPO | Admitting: Radiology

## 2023-01-31 DIAGNOSIS — R0789 Other chest pain: Secondary | ICD-10-CM | POA: Diagnosis not present

## 2023-01-31 DIAGNOSIS — Z91040 Latex allergy status: Secondary | ICD-10-CM | POA: Diagnosis not present

## 2023-01-31 DIAGNOSIS — R079 Chest pain, unspecified: Secondary | ICD-10-CM

## 2023-01-31 DIAGNOSIS — D75839 Thrombocytosis, unspecified: Secondary | ICD-10-CM | POA: Insufficient documentation

## 2023-01-31 LAB — BASIC METABOLIC PANEL
Anion gap: 9 (ref 5–15)
BUN: 11 mg/dL (ref 6–20)
CO2: 27 mmol/L (ref 22–32)
Calcium: 9.6 mg/dL (ref 8.9–10.3)
Chloride: 102 mmol/L (ref 98–111)
Creatinine, Ser: 0.81 mg/dL (ref 0.44–1.00)
GFR, Estimated: >60 mL/min (ref >60)
Glucose, Bld: 100 mg/dL — ABNORMAL HIGH (ref 70–99)
Potassium: 3.7 mmol/L (ref 3.5–5.1)
Sodium: 138 mmol/L (ref 135–145)

## 2023-01-31 LAB — CBC
HCT: 37.4 % (ref 36.0–46.0)
Hemoglobin: 12.3 g/dL (ref 12.0–15.0)
MCH: 27.6 pg (ref 26.0–34.0)
MCHC: 32.9 g/dL (ref 30.0–36.0)
MCV: 84 fL (ref 80.0–100.0)
Platelets: 415 10*3/uL — ABNORMAL HIGH (ref 150–400)
RBC: 4.45 MIL/uL (ref 3.87–5.11)
RDW: 14.6 % (ref 11.5–15.5)
WBC: 6.9 10*3/uL (ref 4.0–10.5)
nRBC: 0 % (ref 0.0–0.2)

## 2023-01-31 LAB — TROPONIN I (HIGH SENSITIVITY): Troponin I (High Sensitivity): <2 ng/L (ref <18)

## 2023-01-31 LAB — HCG, SERUM, QUALITATIVE: Preg, Serum: NEGATIVE

## 2023-01-31 NOTE — ED Provider Notes (Addendum)
[facility] Provider Note   CSN: [id number] Arrival date & time: 01/31/23  2128     History  Chief Complaint  Patient presents with   Chest Pain    Bonnie Carr is a 42 y.o. female.  The history is provided by the patient.  Chest Pain She ahs a history of paroxysmal atrial fibrillation (not on anticoagulants), GERD complains of intermittent left-sided chest pain today.  Pain is described as a burning, aching pain which can be in various locations on the left side of her chest.  Pain will last for 1-2 minutes before resolving.  She denies dyspnea, diaphoresis.  She has had some nausea, but she has actually been troubled by nausea for the last several weeks and has not noted any worsening of nausea with this episode of pain.  She is a non-smoker and denies history of hypertension, diabetes, hyperlipidemia.  There is no family history of premature coronary atherosclerosis although her mother did die at age 75 of a myocardial infarction.  She denies history of thromboembolic disease, denies recent travel or surgery.   Home Medications Prior to Admission medications   Medication Sig Start Date End Date Taking? Authorizing Provider  acetaminophen (TYLENOL) 325 MG tablet Take 650 mg by mouth every 6 (six) hours as needed.    [provider]  calcium carbonate (TUMS - DOSED IN MG ELEMENTAL CALCIUM) 500 MG chewable tablet Chew 1 tablet by mouth 2 (two) times daily as needed for indigestion or heartburn.    [provider]  diltiazem (CARDIZEM CD) 120 MG 24 hr capsule Take 1 capsule (120 mg total) by mouth daily. 09/12/22   Lorretta Harp, MD  meclizine (ANTIVERT) 25 MG tablet Take 1 tablet (25 mg total) by mouth 3 (three) times daily as needed for dizziness. 07/18/22   Veryl Speak, MD      Allergies    Latex and Other    Review of Systems   Review of Systems  Cardiovascular:  Positive for chest pain.  All other systems  reviewed and are negative.   Physical Exam Updated Vital Signs BP 134/89 (BP Location: Right Arm)   Pulse 95   Temp 97.8 F (36.6 C)   Resp 16   Ht 5\' 6"  (1.676 m)   Wt 121.6 kg   SpO2 100%   BMI 43.27 kg/m  Physical Exam Vitals and nursing note reviewed.   42 year old female, resting comfortably and in no acute distress. Vital signs are normal. Oxygen saturation is 100%, which is normal. Head is normocephalic and atraumatic. PERRLA, EOMI. Oropharynx is clear. Neck is nontender and supple without adenopathy or JVD. Back is nontender and there is no CVA tenderness. Lungs are clear without rales, wheezes, or rhonchi. Chest is nontender. Heart has regular rate and rhythm without murmur. Abdomen is soft, flat, nontender. Extremities have no cyanosis or edema, full range of motion is present. Skin is warm and dry without rash. Neurologic: Mental status is normal, cranial nerves are intact, moves all extremities equally.  ED Results / Procedures / Treatments   Labs (all labs ordered are listed, but only abnormal results are displayed) Labs Reviewed  BASIC METABOLIC PANEL - Abnormal; Notable for the following components:      Result Value   Glucose, Bld 100 (*)    All other components within normal limits  CBC - Abnormal; Notable for the following components:   Platelets 415 (*)    All other  components within normal limits  HCG, SERUM, QUALITATIVE  TROPONIN I (HIGH SENSITIVITY)  TROPONIN I (HIGH SENSITIVITY)    EKG EKG Interpretation  Date/Time:  Thursday January 31 2023 21:38:14 EST Ventricular Rate:  88 PR Interval:  158 QRS Duration: 78 QT Interval:  346 QTC Calculation: 418 R Axis:   74 Text Interpretation: Normal sinus rhythm Normal ECG When compared with ECG of 17-Jul-2022 20:37, No significant change was found Confirmed by Delora Fuel ([id number]) on 01/31/2023 11:01:19 PM  Radiology DG Chest 2 View  Result Date: 01/31/2023 CLINICAL DATA:  Chest pain EXAM: CHEST  - 2 VIEW COMPARISON:  Chest x-ray 07/17/2022 FINDINGS: The heart size and mediastinal contours are within normal limits. Both lungs are clear. The visualized skeletal structures are unremarkable. IMPRESSION: No active cardiopulmonary disease. Electronically Signed   By: Ronney Asters M.D.   On: 01/31/2023 22:14    Procedures Procedures   Cardiac monitor shows normal sinus rhythm, per my interpretation.  Medications Ordered in ED Medications - No data to display  ED Course/ Medical Decision Making/ A&P  Heart score is 1 (1.4 risk factor of obesity) which puts her at low risk for major adverse cardiac events in the next 6 weeks.  Medical Decision Making Amount and/or Complexity of Data Reviewed Labs: ordered. Radiology: ordered.   Chest pain which is highly atypical, suspect exacerbation of GERD.  I have reviewed and interpreted her electrocardiogram, and my interpretation is normal electrocardiogram.  Chest x-ray shows no acute cardiopulmonary pathology.  I have independently viewed the images, and agree with radiologist interpretation.  I reviewed and interpreted her laboratory tests, and my interpretation is normal basic metabolic panel, normal initial troponin, CBC significant for mild thrombocytosis of uncertain significance.  On reviewing prior laboratory test results, she has run a mild thrombocytosis since 2021.  Repeat troponin is pending.  Differential diagnosis does include pulmonary embolism, but she has no risk factors for pulmonary embolism and has normal heart rate and oxygen saturation.  No risk factors for aortic dissection, no localized tenderness to suggest chest wall pain.  I have reviewed her past records, and she had been admitted on 05/15/2022 for atrial fibrillation with rapid ventricular response, found to have CHA2DS2-VASc score of 1 and discharged without anticoagulation.  At that time, echocardiogram was normal.  Repeat troponin is normal.  Patient is discharged.  I  have advised her to try using antacids and over-the-counter proton pump inhibitors.  Follow-up with her primary care provider and her cardiologist.  Final Clinical Impression(s) / ED Diagnoses Final diagnoses:  Nonspecific chest pain  Thrombocytosis    Rx / DC Orders ED Discharge Orders     None         Delora Fuel, MD 32/20/25 [id number]    Delora Fuel, MD 62/37/62 [phone]

## 2023-01-31 NOTE — ED Triage Notes (Signed)
Patient here POV from Home.  Endorses CP that began earlier today. Intermittent in nature and Mostly Left Chest. Burning and aching.   Some nausea and Dizziness. No Emesis or SOB. History of Atrial Fib. Takes Cardizem.   NAD Noted during Triage. A&Ox4. GCS 15. Ambulatory.

## 2023-02-01 LAB — TROPONIN I (HIGH SENSITIVITY): Troponin I (High Sensitivity): <2 ng/L (ref <18)

## 2023-02-01 NOTE — ED Notes (Signed)
Dr. Glick at bedside.  

## 2023-02-01 NOTE — Discharge Instructions (Signed)
Your evaluation today did not show any serious cause for your chest pain.  You may try taking antacids as needed, consider taking omeprazole (Prilosec OTC) daily for the next 2 weeks.  Return if you have any new or concerning symptoms.

## 2023-02-12 ENCOUNTER — Telehealth: Payer: Self-pay | Admitting: Cardiovascular Disease

## 2023-02-12 MED ORDER — DILTIAZEM HCL ER COATED BEADS 120 MG PO CP24: 120.0000 mg | ORAL_CAPSULE | Freq: Every day | ORAL | 0 refills | Status: DC

## 2023-02-12 NOTE — Telephone Encounter (Signed)
Spoke with patient and patient verbalized understanding of desired medication being sent to desired pharmacy.

## 2023-02-12 NOTE — Telephone Encounter (Signed)
*  STAT* If patient is at the pharmacy, call can be transferred to refill team.   1. Which medications need to be refilled? (please list name of each medication and dose if known)  diltiazem (CARDIZEM CD) 120 MG 24 hr capsule  2. Which pharmacy/location (including street and city if local pharmacy) is medication to be sent to? [facility], [address]  3. Do they need a 30 day or 90 day supply?  90 day supply

## 2023-02-19 ENCOUNTER — Ambulatory Visit: Payer: BC Managed Care – PPO | Attending: Nurse Practitioner | Admitting: Nurse Practitioner

## 2023-02-19 ENCOUNTER — Encounter: Payer: Self-pay | Admitting: Nurse Practitioner

## 2023-02-19 VITALS — BP 112/80 | HR 87 | Ht 66.0 in | Wt 286.2 lb

## 2023-02-19 DIAGNOSIS — G4733 Obstructive sleep apnea (adult) (pediatric): Secondary | ICD-10-CM

## 2023-02-19 DIAGNOSIS — I480 Paroxysmal atrial fibrillation: Secondary | ICD-10-CM

## 2023-02-19 DIAGNOSIS — R0789 Other chest pain: Secondary | ICD-10-CM

## 2023-02-19 DIAGNOSIS — R7303 Prediabetes: Secondary | ICD-10-CM

## 2023-02-19 NOTE — Patient Instructions (Signed)
Medication Instructions:  Your physician recommends that you continue on your current medications as directed. Please refer to the Current Medication list given to you today.  *If you need a refill on your cardiac medications before your next appointment, please call your pharmacy*   Lab Work: NONE ordered at this time of appointment   If you have labs (blood work) drawn today and your tests are completely normal, you will receive your results only by: [facility] (if you have MyChart) OR A paper copy in the mail If you have any lab test that is abnormal or we need to change your treatment, we will call you to review the results.   Testing/Procedures: Your physician has recommended that you have a sleep study. This test records several body functions during sleep, including: brain activity, eye movement, oxygen and carbon dioxide blood levels, heart rate and rhythm, breathing rate and rhythm, the flow of air through your mouth and nose, snoring, body muscle movements, and chest and belly movement.    Follow-Up: At [facility], you and your health needs are our priority.  As part of our continuing mission to provide you with exceptional heart care, we have created designated Provider Care Teams.  These Care Teams include your primary Cardiologist (physician) and Advanced Practice Providers (APPs -  Physician Assistants and Nurse Practitioners) who all work together to provide you with the care you need, when you need it.  We recommend signing up for the patient portal called "MyChart".  Sign up information is provided on this After Visit Summary.  MyChart is used to connect with patients for Virtual Visits (Telemedicine).  Patients are able to view lab/test results, encounter notes, upcoming appointments, etc.  Non-urgent messages can be sent to your provider as well.   To learn more about what you can do with MyChart, go to [email or web address].    Your next appointment:    3 month(s)  Provider:   Diona Browner, NP        Other Instructions

## 2023-02-19 NOTE — Progress Notes (Unsigned)
Office Visit    Patient Name: Bonnie Carr Date of Encounter: 02/19/2023  Primary Care Provider:  Maury Dus, MD (Inactive) Primary Cardiologist:  Quay Burow, MD  Chief Complaint    42 year old female with a history of paroxysmal atrial fibrillation, palpitations, OSA, obesity, prediabetes, GERD, and anxiety who presents for follow-up related to atrial fibrillation.  Past Medical History    Past Medical History:  Diagnosis Date   GERD (gastroesophageal reflux disease)    Irregular heart beats    Kidney stone    Pre-diabetes    Trichomonas    Past Surgical History:  Procedure Laterality Date   CERVICAL CERCLAGE N/A 03/14/2018   Procedure: CERCLAGE CERVICAL;  Surgeon: Everett Graff, MD;  Location: [facility];  Service: Gynecology;  Laterality: N/A;   CHOLECYSTECTOMY     HYSTEROSCOPY WITH D & C N/A 08/24/2016   Procedure: DILATATION AND CURETTAGE /HYSTEROSCOPY;  Surgeon: Sanjuana Kava, MD;  Location: [facility];  Service: Gynecology;  Laterality: N/A;   TONSILLECTOMY      Allergies  Allergies  Allergen Reactions   Latex Itching   Other Hives and Rash    HOUSE DUST, WEED POLLEN     Labs/Other Studies Reviewed    The following studies were reviewed today: Echo 04/2022: PROCEDURE  A two-dimensional transthoracic echocardiogram with color flow and  Doppler was performed. Image Quality  Technically adequate.  -  SUMMARY  The left ventricular size is normal.  Left ventricular systolic function is normal.  LV ejection fraction = 60-65%.  Left ventricular filling pattern is normal.  The right ventricle is normal in size and function.  There was insufficient TR detected to calculate RV systolic pressure.  The IVC is normal in size with an inspiratory collapse of greater then  50%, suggesting normal right atrial pressure.  There is no significant valvular stenosis or regurgitation.  There is no pericardial effusion.   Recent Labs: 01/31/2023: BUN 11; Creatinine, Ser  0.81; Hemoglobin 12.3; Platelets 415; Potassium 3.7; Sodium 138  Recent Lipid Panel    Component Value Date/Time   CHOL 155 05/16/2021 0852   TRIG 208 (H) 05/16/2021 0852   HDL 41 05/16/2021 0852   CHOLHDL 3.8 05/16/2021 0852   CHOLHDL 4 12/04/2013 1251   VLDL 22.0 12/04/2013 1251   LDLCALC 79 05/16/2021 0852    History of Present Illness    42 year old female with the above past medical history including paroxysmal atrial fibrillation, palpitations, OSA, obesity, prediabetes, GERD, and anxiety.  She was originally referred to Dr. Gwenlyn Found in 2019 in the setting of palpitations.  She was hospitalized in May 2023 in the setting of new onset atrial fibrillation.  She converted to sinus rhythm with pharmacological therapy.  She was not started on anticoagulation (CHA2DS2VASc =1).  She was started on p.o. Cardizem.  Echocardiogram was normal at the time.  She was last seen in the office on 05/23/2022 and was stable from a cardiac standpoint.  She denies any palpitations, denies symptoms concerning for angina.  She was referred to the [facility] in the setting of obesity.  She was evaluated in the ED on 01/31/2023 in the setting of chest pain.  EKG was normal, chest x-ray was unremarkable. Troponin was negative x 2. It was felt that her symptoms were likely related to GERD.  She was discharged home in stable condition and advised to follow-up with cardiology as an outpatient.   She presents today for follow-up.  Since her last  visit she has been stable from a cardiac standpoint.  She has made significant changes to her diet and has started exercising in the past 2 weeks.  She has noted occasional chest tightness/pressure both at rest and occasionally with exertion, she thinks her symptoms are likely related to GERD.  She denies any palpitations, dyspnea, dizziness, edema, PND, orthopnea, weight gain. She  notes she never received her CPAP.  Other than her occasional chest  tightness, she reports feeling well.  Home Medications    Current Outpatient Medications  Medication Sig Dispense Refill   acetaminophen (TYLENOL) 325 MG tablet Take 650 mg by mouth every 6 (six) hours as needed.     diltiazem (CARDIZEM CD) 120 MG 24 hr capsule Take 1 capsule (120 mg total) by mouth daily. Patient may ask for refills at upcoming appointment 90 capsule 0   calcium carbonate (TUMS - DOSED IN MG ELEMENTAL CALCIUM) 500 MG chewable tablet Chew 1 tablet by mouth 2 (two) times daily as needed for indigestion or heartburn. (Patient not taking: Reported on 02/19/2023)     Cetirizine HCl (ZYRTEC ALLERGY) 10 MG CAPS Take by oral route.     meclizine (ANTIVERT) 25 MG tablet Take 1 tablet (25 mg total) by mouth 3 (three) times daily as needed for dizziness. (Patient not taking: Reported on 02/19/2023) 20 tablet 0   No current facility-administered medications for this visit.     Review of Systems    She denies palpitations, dyspnea, pnd, orthopnea, n, v, dizziness, syncope, edema, weight gain, or early satiety. All other systems reviewed and are otherwise negative except as noted above.   Physical Exam    VS:  BP 112/80 (BP Location: Left Arm, Patient Position: Sitting, Cuff Size: Large)   Pulse 87   Ht 5' 6"$  (1.676 m)   Wt 286 lb 3.2 oz (129.8 kg)   SpO2 97%   BMI 46.19 kg/m  GEN: Well nourished, well developed, in no acute distress. HEENT: normal. Neck: Supple, no JVD, carotid bruits, or masses. Cardiac: RRR, no murmurs, rubs, or gallops. No clubbing, cyanosis, edema.  Radials/DP/PT 2+ and equal bilaterally.  Respiratory:  Respirations regular and unlabored, clear to auscultation bilaterally. GI: Soft, nontender, nondistended, BS + x 4. MS: no deformity or atrophy. Skin: warm and dry, no rash. Neuro:  Strength and sensation are intact. Psych: Normal affect.  Accessory Clinical Findings    ECG personally reviewed by me today -no EKG in office today.   Lab Results   Component Value Date   WBC 6.9 01/31/2023   HGB 12.3 01/31/2023   HCT 37.4 01/31/2023   MCV 84.0 01/31/2023   PLT 415 (H) 01/31/2023   Lab Results  Component Value Date   CREATININE 0.81 01/31/2023   BUN 11 01/31/2023   NA 138 01/31/2023   K 3.7 01/31/2023   CL 102 01/31/2023   CO2 27 01/31/2023   Lab Results  Component Value Date   ALT 13 05/16/2021   AST 12 05/16/2021   ALKPHOS 83 05/16/2021   BILITOT <0.2 05/16/2021   Lab Results  Component Value Date   CHOL 155 05/16/2021   HDL 41 05/16/2021   LDLCALC 79 05/16/2021   TRIG 208 (H) 05/16/2021   CHOLHDL 3.8 05/16/2021    No results found for: "HGBA1C"  Assessment & Plan    1. Chest pain: Recent ED visit in the setting of chest pain.  EKG was unremarkable, troponin was negative x 2.  Symptoms were thought to be  related to GERD.  She does note occasional chest tightness/pressure both at rest and occasionally with exertion.  She does think her symptoms are likely related to GERD. We discussed ET calcium scoring, possible ischemic evaluation given family history of CAD including stress test, coronary CT angiogram. Through shared decision making, will defer ischemic evaluation/additional testing at this time.  Continue to monitor symptoms.   2. Paroxysmal atrial fibrillation: Maintaining sinus rhythm on diltiazem.  She is not on anticoagulation (CHA2DS2VASc = 1).   3. OSA: She never received a CPAP machine.  Discussed with sleep coordinator.  It has been since 2022 showed she had a sleep study.  She will require repeat study.  Will order home sleep study.   4. Prediabetes/Obesity: No recent A1c on file.  Monitored and managed per PCP.  Encouraged ongoing lifestyle modifications with diet and exercise.  5. Disposition: Follow-up in 3 months.     Lenna Sciara, NP 02/19/2023, 8:47 AM

## 2023-02-20 ENCOUNTER — Encounter: Payer: Self-pay | Admitting: Nurse Practitioner

## 2023-02-27 ENCOUNTER — Telehealth: Payer: Self-pay | Admitting: *Deleted

## 2023-02-27 NOTE — Telephone Encounter (Signed)
Order for APAP and supplies submitted to [facility]. Approved for [facility]. 02/27/23 to 05/27/23. APAP order and records faxed to [facility] attention Seth Bake.

## 2023-03-22 ENCOUNTER — Telehealth: Payer: Self-pay | Admitting: *Deleted

## 2023-03-22 NOTE — Telephone Encounter (Signed)
Patient notified that per notification letter received from [facility]  they have reached out to her multiple times via phone call as well as mailings to contact them for CPAP set up appointment. She states to me that she has not gotten any phone calls from them "I don't think." She was given the contact phone number to call [facility] for appointment.

## 2023-05-13 ENCOUNTER — Ambulatory Visit: Payer: BC Managed Care – PPO | Admitting: Nurse Practitioner

## 2023-05-14 ENCOUNTER — Telehealth: Payer: Self-pay | Admitting: Cardiovascular Disease

## 2023-05-14 MED ORDER — DILTIAZEM HCL ER COATED BEADS 120 MG PO CP24: 120.0000 mg | ORAL_CAPSULE | Freq: Every day | ORAL | 0 refills | Status: DC

## 2023-05-14 NOTE — Telephone Encounter (Signed)
*  STAT* If patient is at the pharmacy, call can be transferred to refill team.   1. Which medications need to be refilled? (please list name of each medication and dose if known)  diltiazem (CARDIZEM CD) 120 MG 24 hr capsule  2. Which pharmacy/location (including street and city if local pharmacy) is medication to be sent to? [facility] [address] [place] - [address]   3. Do they need a 30 day or 90 day supply? 90

## 2023-05-14 NOTE — Telephone Encounter (Signed)
Patient appt cancelled by our office, needed refill to last until follow up.  Refill sent. Patient aware

## 2023-06-07 NOTE — Progress Notes (Signed)
Cardiology Clinic Note   Patient Name: Bonnie Carr Date of Encounter: 06/20/2023  Primary Care Provider:  Elias Else, MD (Inactive) Primary Cardiologist:  Nanetta Batty, MD  Patient Profile    42 year old female with history of PAF, OSA, prediabetes, obesity, GERD, anxiety.  Atrial fibrillation was diagnosed in 2023 during hospitalization, and was converted to normal sinus rhythm with pharmacologic therapy.  CHA2DS2-VASc score of 1 and therefore not started on anticoagulation therapy.  Past Medical History    Past Medical History:  Diagnosis Date   GERD (gastroesophageal reflux disease)    Irregular heart beats    Kidney stone    Pre-diabetes    Trichomonas    Past Surgical History:  Procedure Laterality Date   CERVICAL CERCLAGE N/A 03/14/2018   Procedure: CERCLAGE CERVICAL;  Surgeon: Osborn Coho, MD;  Location: [facility];  Service: Gynecology;  Laterality: N/A;   CHOLECYSTECTOMY     HYSTEROSCOPY WITH D & C N/A 08/24/2016   Procedure: DILATATION AND CURETTAGE /HYSTEROSCOPY;  Surgeon: Essie Hart, MD;  Location: [facility];  Service: Gynecology;  Laterality: N/A;   TONSILLECTOMY      Allergies  Allergies  Allergen Reactions   Latex Itching   Other Hives and Rash    HOUSE DUST, WEED POLLEN    History of Present Illness    Bonnie Carr is here for ongoing assessment and management of PAF, with occasional chest pain with consideration to have coronary CTA discussed on prior visit on 02/19/2023 by Bernadene Person  DNP but patient deferred.  Repeat sleep study was ordered in order to have her obtain CPAP machine.  She is here also for refills on diltiazem.   She denies any further complaints of worsening palpitations.  She is going to [facility], has lost 13 pounds, and is on a heart healthy diet.  She occasionally feels some irregular heart rhythm while she is on the treadmill at the gym.  She has not been able to get in touch with sleep medicine to our practice as they have called and  left messages and she has returned calls but they have not been reconnected.  As result she has not had her home sleep study completed yet. Please Home Medications    Current Outpatient Medications  Medication Sig Dispense Refill   acetaminophen (TYLENOL) 325 MG tablet Take 650 mg by mouth every 6 (six) hours as needed.     Cetirizine HCl (ZYRTEC ALLERGY) 10 MG CAPS Take by oral route.     diltiazem (CARDIZEM CD) 120 MG 24 hr capsule Take 1 capsule (120 mg total) by mouth daily. 90 capsule 3   meclizine (ANTIVERT) 25 MG tablet Take 1 tablet (25 mg total) by mouth 3 (three) times daily as needed for dizziness. 20 tablet 0   Multiple Vitamin (MULTIVITAMIN) tablet Take 1 tablet by mouth daily.     Multiple Vitamins-Minerals (MULTIVITAMIN WITH MINERALS) tablet Take 1 tablet by mouth daily. Sea Moss     calcium carbonate (TUMS - DOSED IN MG ELEMENTAL CALCIUM) 500 MG chewable tablet Chew 1 tablet by mouth 2 (two) times daily as needed for indigestion or heartburn. (Patient not taking: Reported on 02/19/2023)     No current facility-administered medications for this visit.     Family History    Family History  Problem Relation Age of Onset   Hypertension Mother    Heart disease Mother    Diabetes Father    Diabetes Maternal Grandmother    Cancer Maternal Grandmother  Stroke Maternal Uncle    She indicated that her mother is deceased. She indicated that the status of her father is unknown. She indicated that the status of her maternal grandmother is unknown. She indicated that the status of her maternal uncle is unknown.  Social History    Social History   Socioeconomic History   Marital status: Single    Spouse name: Not on file   Number of children: Not on file   Years of education: Not on file   Highest education level: Not on file  Occupational History   Not on file  Tobacco Use   Smoking status: Former    Packs/day: .5    Types: Cigarettes    Quit date: 10/31/2017    Years  since quitting: 5.6   Smokeless tobacco: Never  Substance and Sexual Activity   Alcohol use: Yes    Comment: wine occassioanlly   Drug use: Yes    Frequency: 7.0 times per week    Types: Marijuana   Sexual activity: Not Currently    Birth control/protection: None  Other Topics Concern   Not on file  Social History Narrative   Not on file   Social Determinants of Health   Financial Resource Strain: Not on file  Food Insecurity: Not on file  Transportation Needs: Not on file  Physical Activity: Not on file  Stress: Not on file  Social Connections: Not on file  Intimate Partner Violence: Not on file     Review of Systems    General:  No chills, fever, night sweats or weight changes.  Cardiovascular:  No chest pain, dyspnea on exertion, edema, orthopnea, palpitations, paroxysmal nocturnal dyspnea. Dermatological: No rash, lesions/masses Respiratory: No cough, dyspnea Urologic: No hematuria, dysuria Abdominal:   No nausea, vomiting, diarrhea, bright red blood per rectum, melena, or hematemesis Neurologic:  No visual changes, wkns, changes in mental status. All other systems reviewed and are otherwise negative except as noted above.     Physical Exam    VS:  BP 128/64   Pulse 83   Ht 5\' 7"  (1.702 m)   Wt 272 lb 12.8 oz (123.7 kg)   SpO2 99%   BMI 42.73 kg/m  , BMI Body mass index is 42.73 kg/m. STOP-Bang Score:  4      GEN: Well nourished, well developed, in no acute distress. HEENT: normal. Neck: Supple, no JVD, carotid bruits, or masses. Cardiac: RRR, no murmurs, rubs, or gallops. No clubbing, cyanosis, edema.  Radials/DP/PT 2+ and equal bilaterally.  Respiratory:  Respirations regular and unlabored, clear to auscultation bilaterally. GI: Soft, nontender, nondistended, BS + x 4. MS: no deformity or atrophy. Skin: warm and dry, no rash. Neuro:  Strength and sensation are intact. Psych: Normal affect.  Accessory Clinical Findings    ECG personally reviewed by  me today-normal sinus rhythm heart rate of 83 bpm.  Inverted T waves in aVR.  Lab Results  Component Value Date   WBC 6.9 01/31/2023   HGB 12.3 01/31/2023   HCT 37.4 01/31/2023   MCV 84.0 01/31/2023   PLT 415 (H) 01/31/2023   Lab Results  Component Value Date   CREATININE 0.81 01/31/2023   BUN 11 01/31/2023   NA 138 01/31/2023   K 3.7 01/31/2023   CL 102 01/31/2023   CO2 27 01/31/2023   Lab Results  Component Value Date   ALT 13 05/16/2021   AST 12 05/16/2021   ALKPHOS 83 05/16/2021   BILITOT <0.2 05/16/2021  Lab Results  Component Value Date   CHOL 155 05/16/2021   HDL 41 05/16/2021   LDLCALC 79 05/16/2021   TRIG 208 (H) 05/16/2021   CHOLHDL 3.8 05/16/2021    No results found for: "HGBA1C"  Review of Prior Studies: Echo 04/2022: PROCEDURE  A two-dimensional transthoracic echocardiogram with color flow and  Doppler was performed. Image Quality  Technically adequate.  -  SUMMARY  The left ventricular size is normal.  Left ventricular systolic function is normal.  LV ejection fraction = 60-65%.  Left ventricular filling pattern is normal.  The right ventricle is normal in size and function.  There was insufficient TR detected to calculate RV systolic pressure.  The IVC is normal in size with an inspiratory collapse of greater then  50%, suggesting normal right atrial pressure.  There is no significant valvular stenosis or regurgitation.  There is no pericardial effusion.   Assessment & Plan   1.  PAF: Currently on diltiazem but not on anticoagulation therapy.  Refills until that are provided.  She is feeling irregular heart rhythm rarely while at the gym working out otherwise she has not noticed them.  Continue current medication regimen.  2.  OSA: Patient will be rescheduled for home sleep study.  She is available during the hours of 730 to 8:30 in the morning, 2:58 in the afternoon, and then after 530 to be able to speak with someone who calls to schedule  her.  I know it has been sent to sleep study coordinator.  3.  Morbid obesity: Lifestyle changes to include diet and exercise are now a part of her daily regimen.  She has lost 13 pounds and feels encouraged to continue.  He is congratulated on this change in her diet and activity.            Signed, Bettey Mare. Liborio Nixon, ANP, AACC   06/20/2023 5:04 PM      Office [phone] Fax [phone]  Notice: This dictation was prepared with Dragon dictation along with smaller phrase technology. Any transcriptional errors that result from this process are unintentional and may not be corrected upon review.

## 2023-06-20 ENCOUNTER — Ambulatory Visit: Payer: BC Managed Care – PPO | Attending: Nurse Practitioner | Admitting: Adult Health

## 2023-06-20 ENCOUNTER — Encounter: Payer: Self-pay | Admitting: Adult Health

## 2023-06-20 VITALS — BP 128/64 | HR 83 | Ht 67.0 in | Wt 272.8 lb

## 2023-06-20 DIAGNOSIS — G4733 Obstructive sleep apnea (adult) (pediatric): Secondary | ICD-10-CM

## 2023-06-20 DIAGNOSIS — I480 Paroxysmal atrial fibrillation: Secondary | ICD-10-CM

## 2023-06-20 DIAGNOSIS — R0789 Other chest pain: Secondary | ICD-10-CM | POA: Diagnosis not present

## 2023-06-20 MED ORDER — DILTIAZEM HCL ER COATED BEADS 120 MG PO CP24: 120.0000 mg | ORAL_CAPSULE | Freq: Every day | ORAL | 3 refills | Status: DC

## 2023-06-20 NOTE — Patient Instructions (Signed)
Medication Instructions:  No Changes *If you need a refill on your cardiac medications before your next appointment, please call your pharmacy*   Lab Work: No Labs If you have labs (blood work) drawn today and your tests are completely normal, you will receive your results only by: MyChart Message (if you have MyChart) OR A paper copy in the mail If you have any lab test that is abnormal or we need to change your treatment, we will call you to review the results.   Testing/Procedures: No Testing   Follow-Up: At [facility], you and your health needs are our priority.  As part of our continuing mission to provide you with exceptional heart care, we have created designated Provider Care Teams.  These Care Teams include your primary Cardiologist (physician) and Advanced Practice Providers (APPs -  Physician Assistants and Nurse Practitioners) who all work together to provide you with the care you need, when you need it.  We recommend signing up for the patient portal called "MyChart".  Sign up information is provided on this After Visit Summary.  MyChart is used to connect with patients for Virtual Visits (Telemedicine).  Patients are able to view lab/test results, encounter notes, upcoming appointments, etc.  Non-urgent messages can be sent to your provider as well.   To learn more about what you can do with MyChart, go to [email or web address].    Your next appointment:   3 month(s)  Provider:   Bernadene Person, NP

## 2023-09-20 ENCOUNTER — Ambulatory Visit: Payer: BC Managed Care – PPO | Attending: Nurse Practitioner | Admitting: Nurse Practitioner

## 2023-09-20 ENCOUNTER — Encounter: Payer: Self-pay | Admitting: Nurse Practitioner

## 2023-09-20 VITALS — BP 124/72 | HR 74 | Ht 66.0 in | Wt 272.2 lb

## 2023-09-20 DIAGNOSIS — R7303 Prediabetes: Secondary | ICD-10-CM | POA: Diagnosis not present

## 2023-09-20 DIAGNOSIS — I480 Paroxysmal atrial fibrillation: Secondary | ICD-10-CM | POA: Diagnosis not present

## 2023-09-20 DIAGNOSIS — G4733 Obstructive sleep apnea (adult) (pediatric): Secondary | ICD-10-CM

## 2023-09-20 MED ORDER — DILTIAZEM HCL ER COATED BEADS 120 MG PO CP24: 120.0000 mg | ORAL_CAPSULE | Freq: Every day | ORAL | 3 refills | Status: DC

## 2023-09-20 NOTE — Progress Notes (Signed)
Office Visit    Patient Name: Bonnie Carr Date of Encounter: 09/20/2023  Primary Care Provider:  Elias Else, MD (Inactive) Primary Cardiologist:  Nanetta Batty, MD  Chief Complaint    42 year old female with a history of paroxysmal atrial fibrillation, palpitations, OSA, obesity, prediabetes, GERD, and anxiety who presents for follow-up related to atrial fibrillation.   Past Medical History    Past Medical History:  Diagnosis Date   GERD (gastroesophageal reflux disease)    Irregular heart beats    Kidney stone    Pre-diabetes    Trichomonas    Past Surgical History:  Procedure Laterality Date   CERVICAL CERCLAGE N/A 03/14/2018   Procedure: CERCLAGE CERVICAL;  Surgeon: Osborn Coho, MD;  Location: [facility];  Service: Gynecology;  Laterality: N/A;   CHOLECYSTECTOMY     HYSTEROSCOPY WITH D & C N/A 08/24/2016   Procedure: DILATATION AND CURETTAGE /HYSTEROSCOPY;  Surgeon: Essie Hart, MD;  Location: [facility];  Service: Gynecology;  Laterality: N/A;   TONSILLECTOMY      Allergies  Allergies  Allergen Reactions   Latex Itching   Other Hives and Rash    HOUSE DUST, WEED POLLEN     Labs/Other Studies Reviewed    The following studies were reviewed today:     Recent Labs: 01/31/2023: BUN 11; Creatinine, Ser 0.81; Hemoglobin 12.3; Platelets 415; Potassium 3.7; Sodium 138  Recent Lipid Panel    Component Value Date/Time   CHOL 155 05/16/2021 0852   TRIG 208 (H) 05/16/2021 0852   HDL 41 05/16/2021 0852   CHOLHDL 3.8 05/16/2021 0852   CHOLHDL 4 12/04/2013 1251   VLDL 22.0 12/04/2013 1251   LDLCALC 79 05/16/2021 0852    History of Present Illness    42 year old female with the above past medical history including paroxysmal atrial fibrillation, palpitations, OSA, obesity, prediabetes, GERD, and anxiety.   She was originally referred to Dr. Allyson Sabal in 2019 in the setting of palpitations.  She was hospitalized in May 2023 in the setting of new onset atrial  fibrillation.  She converted to sinus rhythm with pharmacological therapy.  She was not started on anticoagulation (CHA2DS2VASc =1).  She was started on diltiazem.  Echocardiogram was normal at the time. She was referred to the [facility] in the setting of obesity.  She was evaluated in the ED on 01/31/2023 in the setting of chest pain.  EKG was normal, chest x-ray was unremarkable. Troponin was negative x 2. It was felt that her symptoms were likely related to GERD.  She was discharged home in stable condition and advised to follow-up with cardiology as an outpatient. She was last seen in the office on 06/20/2023 and was stable from a cardiac standpoint.  She noted occasional feelings of irregular heart rhythm while working at the gym.  She denied chest pain.  She presents today for follow-up.  Since her last visit she has been stable on a cardiac standpoint.  She notes intermittent fleeting palpitations, is any associated symptoms, denies any breakthrough atrial fibrillation, denies symptoms concerning for angina.  She continues to exercise regularly.  Unfortunately, she has not heard back from our sleep coordinator and therefore, has not repeated a sleep study.  Overall, she reports feeling well.   Home Medications    Current Outpatient Medications  Medication Sig Dispense Refill   acetaminophen (TYLENOL) 325 MG tablet Take 650 mg by mouth every 6 (six) hours as needed.     calcium carbonate (TUMS -  DOSED IN MG ELEMENTAL CALCIUM) 500 MG chewable tablet Chew 1 tablet by mouth 2 (two) times daily as needed for indigestion or heartburn.     Cetirizine HCl (ZYRTEC ALLERGY) 10 MG CAPS Take by oral route.     Multiple Vitamin (MULTIVITAMIN) tablet Take 1 tablet by mouth daily.     Multiple Vitamins-Minerals (MULTIVITAMIN WITH MINERALS) tablet Take 1 tablet by mouth daily. Sea Moss     diltiazem (CARDIZEM CD) 120 MG 24 hr capsule Take 1 capsule (120 mg total) by mouth daily. 90  capsule 3   meclizine (ANTIVERT) 25 MG tablet Take 1 tablet (25 mg total) by mouth 3 (three) times daily as needed for dizziness. (Patient not taking: Reported on 09/20/2023) 20 tablet 0   No current facility-administered medications for this visit.     Review of Systems    She denies chest pain, dyspnea, pnd, orthopnea, n, v, dizziness, syncope, edema, weight gain, or early satiety. All other systems reviewed and are otherwise negative except as noted above.   Physical Exam    VS:  BP 124/72 (BP Location: Left Arm, Patient Position: Sitting, Cuff Size: Large)   Pulse 74   Ht 5\' 6"  (1.676 m)   Wt 272 lb 3.2 oz (123.5 kg)   BMI 43.93 kg/m   STOP-Bang Score:  4      GEN: Well nourished, well developed, in no acute distress. HEENT: normal. Neck: Supple, no JVD, carotid bruits, or masses. Cardiac: RRR, no murmurs, rubs, or gallops. No clubbing, cyanosis, edema.  Radials/DP/PT 2+ and equal bilaterally.  Respiratory:  Respirations regular and unlabored, clear to auscultation bilaterally. GI: Soft, nontender, nondistended, BS + x 4. MS: no deformity or atrophy. Skin: warm and dry, no rash. Neuro:  Strength and sensation are intact. Psych: Normal affect.  Accessory Clinical Findings    ECG personally reviewed by me today - EKG Interpretation Date/Time:  Friday September 20 2023 15:13:40 EDT Ventricular Rate:  84 PR Interval:  162 QRS Duration:  76 QT Interval:  362 QTC Calculation: 427 R Axis:   56  Text Interpretation: Normal sinus rhythm Normal ECG When compared with ECG of 20-Jun-2023 15:22, No significant change was found Confirmed by Bernadene Person ([id number]) on 09/20/2023 3:16:59 PM  - no acute changes.   Lab Results  Component Value Date   WBC 6.9 01/31/2023   HGB 12.3 01/31/2023   HCT 37.4 01/31/2023   MCV 84.0 01/31/2023   PLT 415 (H) 01/31/2023   Lab Results  Component Value Date   CREATININE 0.81 01/31/2023   BUN 11 01/31/2023   NA 138 01/31/2023   K 3.7 01/31/2023    CL 102 01/31/2023   CO2 27 01/31/2023   Lab Results  Component Value Date   ALT 13 05/16/2021   AST 12 05/16/2021   ALKPHOS 83 05/16/2021   BILITOT <0.2 05/16/2021   Lab Results  Component Value Date   CHOL 155 05/16/2021   HDL 41 05/16/2021   LDLCALC 79 05/16/2021   TRIG 208 (H) 05/16/2021   CHOLHDL 3.8 05/16/2021    No results found for: "HGBA1C"  Assessment & Plan   1. Chest pain: Prior ED visit in the setting of chest pain.  She ultimately felt her symptoms were related to GERD.  Stable with no anginal symptoms.  No indication for ischemic evaluation.    2. Paroxysmal atrial fibrillation: Maintaining sinus rhythm on diltiazem.  Intermittent fleeting palpitations, unchanged from prior visits.  She is not on anticoagulation (  CHA2DS2VASc = 1).  Overall stable.   3. OSA: She never received a CPAP machine.  We had previously reached out to our sleep coordinator.  It has been since 2022 showed she had a sleep study.  Repeat sleep study was ordered. She was contacted by our office, she returned the phone call and never heard back.  In this setting, will refer to pulmonology for further management of sleep apnea.    4. Prediabetes/Obesity: No recent A1c on file.  Monitored and managed per PCP.  Encouraged ongoing lifestyle modifications with diet and exercise.   5. Disposition: Follow-up in 6 months.      Joylene Grapes, NP 09/20/2023, 3:47 PM

## 2023-09-20 NOTE — Patient Instructions (Signed)
Medication Instructions:  Your physician recommends that you continue on your current medications as directed. Please refer to the Current Medication list given to you today.  *If you need a refill on your cardiac medications before your next appointment, please call your pharmacy*   Lab Work: NONE ordered at this time of appointment    Testing/Procedures: NONE ordered at this time of appointment     Follow-Up: At [facility], you and your health needs are our priority.  As part of our continuing mission to provide you with exceptional heart care, we have created designated Provider Care Teams.  These Care Teams include your primary Cardiologist (physician) and Advanced Practice Providers (APPs -  Physician Assistants and Nurse Practitioners) who all work together to provide you with the care you need, when you need it.  We recommend signing up for the patient portal called "MyChart".  Sign up information is provided on this After Visit Summary.  MyChart is used to connect with patients for Virtual Visits (Telemedicine).  Patients are able to view lab/test results, encounter notes, upcoming appointments, etc.  Non-urgent messages can be sent to your provider as well.   To learn more about what you can do with MyChart, go to [email or web address].    Your next appointment:   6 month(s)  Provider:   Nanetta Batty, MD

## 2024-03-11 ENCOUNTER — Encounter: Payer: Self-pay | Admitting: Cardiovascular Disease

## 2024-03-11 ENCOUNTER — Ambulatory Visit (INDEPENDENT_AMBULATORY_CARE_PROVIDER_SITE_OTHER)

## 2024-03-11 ENCOUNTER — Ambulatory Visit: Payer: BC Managed Care – PPO | Attending: Cardiovascular Disease | Admitting: Cardiovascular Disease

## 2024-03-11 VITALS — BP 120/82 | HR 65 | Ht 67.0 in | Wt 270.2 lb

## 2024-03-11 DIAGNOSIS — G4733 Obstructive sleep apnea (adult) (pediatric): Secondary | ICD-10-CM

## 2024-03-11 DIAGNOSIS — I480 Paroxysmal atrial fibrillation: Secondary | ICD-10-CM

## 2024-03-11 NOTE — Progress Notes (Unsigned)
 Enrolled for [employer] to mail a ZIO XT long term holter monitor to the patients address on file.

## 2024-03-11 NOTE — Assessment & Plan Note (Signed)
 History of morbid obesity with a BMI of 42.  She had lost 30 pounds but has gained it back.  She has not seen her primary care doctor in several years.  She may be a candidate for a GLP-1 agonist, otherwise, she is working out at [employer] 3 days a week.

## 2024-03-11 NOTE — Patient Instructions (Signed)
 Medication Instructions:  Your physician recommends that you continue on your current medications as directed. Please refer to the Current Medication list given to you today.  *If you need a refill on your cardiac medications before your next appointment, please call your pharmacy*   Lab Work: Your physician recommends that you return for lab work in: the next week or 2 for FASTING lipid panel  If you have labs (blood work) drawn today and your tests are completely normal, you will receive your results only by: MyChart Message (if you have MyChart) OR A paper copy in the mail If you have any lab test that is abnormal or we need to change your treatment, we will call you to review the results.   Testing/Procedures: Bonnie Carr- Long Term Monitor Instructions  Your physician has requested you wear a ZIO patch monitor for 14 days.  This is a single patch monitor. [employer] supplies one patch monitor per enrollment. Additional stickers are not available. Please do not apply patch if you will be having a Nuclear Stress Test,  Echocardiogram, Cardiac CT, MRI, or Chest Xray during the period you would be wearing the  monitor. The patch cannot be worn during these tests. You cannot remove and re-apply the  ZIO XT patch monitor.  Your ZIO patch monitor will be mailed 3 day USPS to your address on file. It may take 3-5 days  to receive your monitor after you have been enrolled.  Once you have received your monitor, please review the enclosed instructions. Your monitor  has already been registered assigning a specific monitor serial # to you.  Billing and Patient Assistance Program Information  We have supplied [employer] with any of your insurance information on file for billing purposes. [employer] offers a sliding scale Patient Assistance Program for patients that do not have  insurance, or whose insurance does not completely cover the cost of the ZIO monitor.  You must apply for the Patient Assistance  Program to qualify for this discounted rate.  To apply, please call [employer] at [phone], select option 4, select option 2, ask to apply for  Patient Assistance Program. Meredeth Ide will ask your household income, and how many people  are in your household. They will quote your out-of-pocket cost based on that information.  [employer] will also be able to set up a 94-month, interest-free payment plan if needed.  Applying the monitor   Shave hair from upper left chest.  Hold abrader disc by orange tab. Rub abrader in 40 strokes over the upper left chest as  indicated in your monitor instructions.  Clean area with 4 enclosed alcohol pads. Let dry.  Apply patch as indicated in monitor instructions. Patch will be placed under collarbone on left  side of chest with arrow pointing upward.  Rub patch adhesive wings for 2 minutes. Remove white label marked "1". Remove the white  label marked "2". Rub patch adhesive wings for 2 additional minutes.  While looking in a mirror, press and release button in center of patch. A small green light will  flash 3-4 times. This will be your only indicator that the monitor has been turned on.  Do not shower for the first 24 hours. You may shower after the first 24 hours.  Press the button if you feel a symptom. You will hear a small click. Record Date, Time and  Symptom in the Patient Logbook.  When you are ready to remove the patch, follow instructions on the last 2 pages  of Patient  Logbook. Stick patch monitor onto the last page of Patient Logbook.  Place Patient Logbook in the blue and white box. Use locking tab on box and tape box closed  securely. The blue and white box has prepaid postage on it. Please place it in the mailbox as  soon as possible. Your physician should have your test results approximately 7 days after the  monitor has been mailed back to [facility].  Call [facility] Customer Care at [phone] if you have questions regarding   your ZIO XT patch monitor. Call them immediately if you see an orange light blinking on your  monitor.  If your monitor falls off in less than 4 days, contact our Monitor department at [phone].  If your monitor becomes loose or falls off after 4 days call [employer] at [phone] for  suggestions on securing your monitor    Follow-Up: At [facility], you and your health needs are our priority.  As part of our continuing mission to provide you with exceptional heart care, we have created designated Provider Care Teams.  These Care Teams include your primary Cardiologist (physician) and Advanced Practice Providers (APPs -  Physician Assistants and Nurse Practitioners) who all work together to provide you with the care you need, when you need it.  We recommend signing up for the patient portal called "MyChart".  Sign up information is provided on this After Visit Summary.  MyChart is used to connect with patients for Virtual Visits (Telemedicine).  Patients are able to view lab/test results, encounter notes, upcoming appointments, etc.  Non-urgent messages can be sent to your provider as well.   To learn more about what you can do with MyChart, go to [email or web address].    Your next appointment:   12 month(s)  Provider:   Nanetta Batty, MD     Other Instructions   1st Floor: - Lobby - Registration  - Pharmacy  - Lab - Cafe  2nd Floor: - PV Lab - Diagnostic Testing (echo, CT, nuclear med)  3rd Floor: - Vacant  4th Floor: - TCTS (cardiothoracic surgery) - AFib Clinic - Structural Heart Clinic - Vascular Surgery  - Vascular Ultrasound  5th Floor: - HeartCare Cardiology (general and EP) - Clinical Pharmacy for coumadin, hypertension, lipid, weight-loss medications, and med management appointments    Valet parking services will be available as well.

## 2024-03-11 NOTE — Assessment & Plan Note (Signed)
 History of nocturnal snoring with apparently an abnormal sleep test which was never followed up on.  We will pursue this through pulmonary service.

## 2024-03-11 NOTE — Assessment & Plan Note (Signed)
 History of PAF at [facility] 5/16 which converted pharmacologically.  She has not had recurrent episodes.

## 2024-03-11 NOTE — Progress Notes (Signed)
 03/11/2024 Bonnie Carr   10-28-1981  [id number]  Primary Physician Elias Else, MD Primary Cardiologist: Runell Gess MD FACP, [place], [place], [place]  HPI:  Bonnie Carr is a 43 y.o.   severely overweight single African-American female who unfortunately lost her child right after birth 03/16/2018.  She was referred to me by Dr. Mora Appl, her OB/GYN, for palpitations.    I last saw her in the office 05/23/2022.  She did see Edd Fabian, FNP 08/25/2020.  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  She says this causes significant anxiety.  She has no cardiac risk factors other than tobacco abuse which she has stopped in the past, currently she smokes marijuana instead. Her mother did have a myocardial infarction at age 42.  She is never had a heart attack or stroke.  She does complain of some atypical chest pain when running on the treadmill.  She had palpitations during her pregnancy which have significantly improved since giving birth .   She was admitted to the hospital at [facility] on 5/16 for 1 night.  She had PAF with [facility] and was pharmacologically converted.  She was started on Cardizem.  She did have a positive sleep study and was prescribed CPAP but unfortunately never started this.  Since I saw her 2 years ago she did see Anice Paganini, PA-C in the office 09/20/2023.  She does complain of occasional palpitations when she is on the treadmill and occasionally at rest.  We had ordered a "Zio patch" which she wore but never resulted.  She denies chest pain or shortness of breath.   Current Meds  Medication Sig   acetaminophen (TYLENOL) 325 MG tablet Take 650 mg by mouth every 6 (six) hours as needed.   calcium carbonate (TUMS - DOSED IN MG ELEMENTAL CALCIUM) 500 MG chewable tablet Chew 1 tablet by mouth 2 (two) times daily as needed for indigestion or heartburn.   Cetirizine HCl (ZYRTEC ALLERGY) 10 MG CAPS  Take by oral route.   Multiple Vitamin (MULTIVITAMIN) tablet Take 1 tablet by mouth daily.     Allergies  Allergen Reactions   Latex Itching   Other Hives and Rash    HOUSE DUST, WEED POLLEN    Social History   Socioeconomic History   Marital status: Single    Spouse name: Not on file   Number of children: Not on file   Years of education: Not on file   Highest education level: Not on file  Occupational History   Not on file  Tobacco Use   Smoking status: Former    Current packs/day: 0.00    Types: Cigarettes    Quit date: 10/31/2017    Years since quitting: 6.3   Smokeless tobacco: Never  Substance and Sexual Activity   Alcohol use: Yes    Comment: wine occassioanlly   Drug use: Yes    Frequency: 7.0 times per week    Types: Marijuana   Sexual activity: Not Currently    Birth control/protection: None  Other Topics Concern   Not on file  Social History Narrative   Not on file   Social Drivers of Health   Financial Resource Strain: Not on file  Food Insecurity: Not on file  Transportation Needs: Not on file  Physical Activity: Not on file  Stress: Not on file  Social Connections: Unknown (05/14/2022)   Received from [facility],  [facility]   Social Network    Social Network: Not on file  Intimate Partner Violence: Unknown (04/05/2022)   Received from [facility], [facility]   HITS    Physically Hurt: Not on file    Insult or Talk Down To: Not on file    Threaten Physical Harm: Not on file    Scream or Curse: Not on file     Review of Systems: General: negative for chills, fever, night sweats or weight changes.  Cardiovascular: negative for chest pain, dyspnea on exertion, edema, orthopnea, palpitations, paroxysmal nocturnal dyspnea or shortness of breath Dermatological: negative for rash Respiratory: negative for cough or wheezing Urologic: negative for hematuria Abdominal: negative for nausea, vomiting, diarrhea, bright red blood per rectum,  melena, or hematemesis Neurologic: negative for visual changes, syncope, or dizziness All other systems reviewed and are otherwise negative except as noted above.    Blood pressure 120/82, pulse 65, height 5\' 7"  (1.702 m), weight 270 lb 3.2 oz (122.6 kg), SpO2 95%, unknown if currently breastfeeding.  General appearance: alert and no distress Neck: no adenopathy, no carotid bruit, no JVD, supple, symmetrical, trachea midline, and thyroid not enlarged, symmetric, no tenderness/mass/nodules Lungs: clear to auscultation bilaterally Heart: regular rate and rhythm, S1, S2 normal, no murmur, click, rub or gallop Extremities: extremities normal, atraumatic, no cyanosis or edema Pulses: 2+ and symmetric Skin: Skin color, texture, turgor normal. No rashes or lesions Neurologic: Grossly normal  EKG EKG Interpretation Date/Time:  Wednesday March 11 2024 11:15:51 EDT Ventricular Rate:  65 PR Interval:  158 QRS Duration:  78 QT Interval:  366 QTC Calculation: 380 R Axis:   55  Text Interpretation: Normal sinus rhythm Normal ECG When compared with ECG of 20-Sep-2023 15:13, No significant change was found Confirmed by Nanetta Batty [phone]) on 03/11/2024 11:20:58 AM    ASSESSMENT AND PLAN:   Obstructive sleep apnea History of nocturnal snoring with apparently an abnormal sleep test which was never followed up on.  We will pursue this through pulmonary service.  Morbid obesity (HCC) History of morbid obesity with a BMI of 42.  She had lost 30 pounds but has gained it back.  She has not seen her primary care doctor in several years.  She may be a candidate for a GLP-1 agonist, otherwise, she is working out at [employer] 3 days a week.  PAF (paroxysmal atrial fibrillation) (HCC) History of PAF at [facility] 5/16 which converted pharmacologically.  She has not had recurrent episodes.     Runell Gess MD FACP,FACC,FAHA, [facility] 03/11/2024 11:33 AM

## 2024-03-16 ENCOUNTER — Encounter (HOSPITAL_BASED_OUTPATIENT_CLINIC_OR_DEPARTMENT_OTHER): Payer: Self-pay

## 2024-04-27 DIAGNOSIS — I480 Paroxysmal atrial fibrillation: Secondary | ICD-10-CM

## 2024-09-28 ENCOUNTER — Telehealth: Payer: Self-pay | Admitting: Cardiovascular Disease

## 2024-09-28 MED ORDER — DILTIAZEM HCL ER COATED BEADS 120 MG PO CP24: 120.0000 mg | ORAL_CAPSULE | Freq: Every day | ORAL | 1 refills | Status: AC

## 2024-09-28 NOTE — Telephone Encounter (Signed)
 RX sent in

## 2024-09-28 NOTE — Telephone Encounter (Signed)
*  STAT* If patient is at the pharmacy, call can be transferred to refill team.   1. Which medications need to be refilled? (please list name of each medication and dose if known) diltiazem  (CARDIZEM  CD) 120 MG 24 hr capsule    4. Which pharmacy/location (including street and city if local pharmacy) is medication to be sent to?  [facility] - [place], [place] - [address]     5. Do they need a 30 day or 90 day supply? 90
